# Patient Record
Sex: Female | Born: 1950 | Race: White | Hispanic: No | Marital: Married | Smoking: Never smoker
Health system: Southern US, Community
[De-identification: ages and names within clinical notes are randomized; demographics above are authoritative.]

## PROBLEM LIST (undated history)

## (undated) DIAGNOSIS — E079 Disorder of thyroid, unspecified: Secondary | ICD-10-CM

---

## 1998-03-23 ENCOUNTER — Encounter: Payer: Self-pay | Admitting: Neurosurgery

## 1998-03-23 ENCOUNTER — Ambulatory Visit (HOSPITAL_COMMUNITY): Admission: RE | Admit: 1998-03-23 | Discharge: 1998-03-23 | Payer: Self-pay | Admitting: Neurosurgery

## 1998-04-06 ENCOUNTER — Ambulatory Visit (HOSPITAL_COMMUNITY): Admission: RE | Admit: 1998-04-06 | Discharge: 1998-04-06 | Payer: Self-pay | Admitting: Neurosurgery

## 1998-04-06 ENCOUNTER — Encounter: Payer: Self-pay | Admitting: Neurosurgery

## 1998-12-20 ENCOUNTER — Encounter: Payer: Self-pay | Admitting: Obstetrics and Gynecology

## 1998-12-20 ENCOUNTER — Ambulatory Visit (HOSPITAL_COMMUNITY): Admission: RE | Admit: 1998-12-20 | Discharge: 1998-12-20 | Payer: Self-pay | Admitting: Obstetrics and Gynecology

## 1999-08-22 ENCOUNTER — Other Ambulatory Visit: Admission: RE | Admit: 1999-08-22 | Discharge: 1999-08-22 | Payer: Self-pay | Admitting: Obstetrics and Gynecology

## 1999-12-24 ENCOUNTER — Encounter: Payer: Self-pay | Admitting: Obstetrics and Gynecology

## 1999-12-24 ENCOUNTER — Ambulatory Visit (HOSPITAL_COMMUNITY): Admission: RE | Admit: 1999-12-24 | Discharge: 1999-12-24 | Payer: Self-pay | Admitting: Obstetrics and Gynecology

## 1999-12-28 ENCOUNTER — Encounter: Payer: Self-pay | Admitting: Obstetrics and Gynecology

## 1999-12-28 ENCOUNTER — Encounter: Admission: RE | Admit: 1999-12-28 | Discharge: 1999-12-28 | Payer: Self-pay | Admitting: Obstetrics and Gynecology

## 2000-12-29 ENCOUNTER — Encounter: Admission: RE | Admit: 2000-12-29 | Discharge: 2000-12-29 | Payer: Self-pay | Admitting: Obstetrics and Gynecology

## 2000-12-29 ENCOUNTER — Encounter: Payer: Self-pay | Admitting: Obstetrics and Gynecology

## 2001-12-30 ENCOUNTER — Ambulatory Visit (HOSPITAL_COMMUNITY): Admission: RE | Admit: 2001-12-30 | Discharge: 2001-12-30 | Payer: Self-pay | Admitting: Obstetrics and Gynecology

## 2001-12-30 ENCOUNTER — Encounter: Payer: Self-pay | Admitting: Obstetrics and Gynecology

## 2003-01-03 ENCOUNTER — Ambulatory Visit (HOSPITAL_COMMUNITY): Admission: RE | Admit: 2003-01-03 | Discharge: 2003-01-03 | Payer: Self-pay | Admitting: Obstetrics and Gynecology

## 2004-01-10 ENCOUNTER — Ambulatory Visit (HOSPITAL_COMMUNITY): Admission: RE | Admit: 2004-01-10 | Discharge: 2004-01-10 | Payer: Self-pay | Admitting: Obstetrics and Gynecology

## 2005-01-14 ENCOUNTER — Ambulatory Visit (HOSPITAL_COMMUNITY): Admission: RE | Admit: 2005-01-14 | Discharge: 2005-01-14 | Payer: Self-pay | Admitting: Obstetrics and Gynecology

## 2005-08-20 ENCOUNTER — Other Ambulatory Visit: Admission: RE | Admit: 2005-08-20 | Discharge: 2005-08-20 | Payer: Self-pay | Admitting: Family Medicine

## 2006-01-20 ENCOUNTER — Ambulatory Visit (HOSPITAL_COMMUNITY): Admission: RE | Admit: 2006-01-20 | Discharge: 2006-01-20 | Payer: Self-pay | Admitting: Family Medicine

## 2006-09-03 ENCOUNTER — Other Ambulatory Visit: Admission: RE | Admit: 2006-09-03 | Discharge: 2006-09-03 | Payer: Self-pay | Admitting: Family Medicine

## 2007-02-02 ENCOUNTER — Ambulatory Visit (HOSPITAL_COMMUNITY): Admission: RE | Admit: 2007-02-02 | Discharge: 2007-02-02 | Payer: Self-pay | Admitting: Family Medicine

## 2007-02-19 ENCOUNTER — Encounter: Admission: RE | Admit: 2007-02-19 | Discharge: 2007-02-19 | Payer: Self-pay | Admitting: Family Medicine

## 2007-09-16 ENCOUNTER — Other Ambulatory Visit: Admission: RE | Admit: 2007-09-16 | Discharge: 2007-09-16 | Payer: Self-pay | Admitting: Family Medicine

## 2008-02-19 ENCOUNTER — Ambulatory Visit (HOSPITAL_COMMUNITY): Admission: RE | Admit: 2008-02-19 | Discharge: 2008-02-19 | Payer: Self-pay | Admitting: Family Medicine

## 2008-10-25 ENCOUNTER — Other Ambulatory Visit: Admission: RE | Admit: 2008-10-25 | Discharge: 2008-10-25 | Payer: Self-pay | Admitting: Family Medicine

## 2009-02-23 ENCOUNTER — Ambulatory Visit (HOSPITAL_COMMUNITY): Admission: RE | Admit: 2009-02-23 | Discharge: 2009-02-23 | Payer: Self-pay | Admitting: Family Medicine

## 2009-12-11 ENCOUNTER — Other Ambulatory Visit: Admission: RE | Admit: 2009-12-11 | Discharge: 2009-12-11 | Payer: Self-pay | Admitting: Family Medicine

## 2010-02-27 ENCOUNTER — Ambulatory Visit (HOSPITAL_COMMUNITY): Admission: RE | Admit: 2010-02-27 | Payer: Self-pay | Source: Home / Self Care | Admitting: Family Medicine

## 2010-03-01 ENCOUNTER — Other Ambulatory Visit (HOSPITAL_COMMUNITY): Payer: Self-pay | Admitting: Family Medicine

## 2010-03-01 DIAGNOSIS — Z1231 Encounter for screening mammogram for malignant neoplasm of breast: Secondary | ICD-10-CM

## 2010-03-01 DIAGNOSIS — Z0000 Encounter for general adult medical examination without abnormal findings: Secondary | ICD-10-CM

## 2010-03-04 ENCOUNTER — Encounter: Payer: Self-pay | Admitting: Family Medicine

## 2010-03-28 ENCOUNTER — Ambulatory Visit (HOSPITAL_COMMUNITY): Admission: RE | Admit: 2010-03-28 | Payer: Self-pay | Source: Home / Self Care | Admitting: Family Medicine

## 2010-03-28 ENCOUNTER — Ambulatory Visit (HOSPITAL_COMMUNITY)
Admission: RE | Admit: 2010-03-28 | Discharge: 2010-03-28 | Disposition: A | Discharge status: Home / Self Care | Payer: BC Managed Care – PPO | Source: Ambulatory Visit | Attending: Family Medicine | Admitting: Family Medicine

## 2010-03-28 DIAGNOSIS — Z1231 Encounter for screening mammogram for malignant neoplasm of breast: Secondary | ICD-10-CM | POA: Insufficient documentation

## 2011-02-26 ENCOUNTER — Other Ambulatory Visit (HOSPITAL_COMMUNITY): Payer: Self-pay | Admitting: Family Medicine

## 2011-02-26 DIAGNOSIS — Z1231 Encounter for screening mammogram for malignant neoplasm of breast: Secondary | ICD-10-CM

## 2011-04-02 ENCOUNTER — Ambulatory Visit (HOSPITAL_COMMUNITY)
Admission: RE | Admit: 2011-04-02 | Discharge: 2011-04-02 | Disposition: A | Discharge status: Home / Self Care | Payer: BC Managed Care – PPO | Source: Ambulatory Visit | Attending: Family Medicine | Admitting: Family Medicine

## 2011-04-02 DIAGNOSIS — Z1231 Encounter for screening mammogram for malignant neoplasm of breast: Secondary | ICD-10-CM | POA: Insufficient documentation

## 2013-03-10 ENCOUNTER — Other Ambulatory Visit (HOSPITAL_COMMUNITY)
Admission: RE | Admit: 2013-03-10 | Discharge: 2013-03-10 | Disposition: A | Discharge status: Home / Self Care | Payer: 59 | Source: Ambulatory Visit | Attending: Family Medicine | Admitting: Family Medicine

## 2013-03-10 ENCOUNTER — Other Ambulatory Visit: Payer: Self-pay | Admitting: Family Medicine

## 2013-03-10 DIAGNOSIS — Z0000 Encounter for general adult medical examination without abnormal findings: Secondary | ICD-10-CM | POA: Insufficient documentation

## 2016-01-30 ENCOUNTER — Emergency Department (HOSPITAL_COMMUNITY): Payer: BLUE CROSS/BLUE SHIELD

## 2016-01-30 ENCOUNTER — Encounter (HOSPITAL_COMMUNITY): Payer: Self-pay

## 2016-01-30 ENCOUNTER — Inpatient Hospital Stay (HOSPITAL_COMMUNITY)
Admission: EM | Admit: 2016-01-30 | Discharge: 2016-02-01 | DRG: 193 | Disposition: A | Discharge status: Home / Self Care | Payer: BLUE CROSS/BLUE SHIELD | Attending: Internal Medicine | Admitting: Internal Medicine

## 2016-01-30 DIAGNOSIS — E860 Dehydration: Secondary | ICD-10-CM | POA: Diagnosis present

## 2016-01-30 DIAGNOSIS — J181 Lobar pneumonia, unspecified organism: Secondary | ICD-10-CM | POA: Diagnosis not present

## 2016-01-30 DIAGNOSIS — J189 Pneumonia, unspecified organism: Secondary | ICD-10-CM | POA: Diagnosis present

## 2016-01-30 DIAGNOSIS — D649 Anemia, unspecified: Secondary | ICD-10-CM | POA: Diagnosis present

## 2016-01-30 DIAGNOSIS — E039 Hypothyroidism, unspecified: Secondary | ICD-10-CM | POA: Diagnosis present

## 2016-01-30 DIAGNOSIS — E871 Hypo-osmolality and hyponatremia: Secondary | ICD-10-CM | POA: Diagnosis present

## 2016-01-30 DIAGNOSIS — J9601 Acute respiratory failure with hypoxia: Secondary | ICD-10-CM

## 2016-01-30 DIAGNOSIS — R509 Fever, unspecified: Secondary | ICD-10-CM | POA: Diagnosis not present

## 2016-01-30 HISTORY — DX: Disorder of thyroid, unspecified: E07.9

## 2016-01-30 LAB — CBC WITH DIFFERENTIAL/PLATELET
Basophils Absolute: 0 10*3/uL (ref 0.0–0.1)
Basophils Relative: 0 %
Eosinophils Absolute: 0 10*3/uL (ref 0.0–0.7)
Eosinophils Relative: 0 %
HCT: 36 % (ref 36.0–46.0)
Hemoglobin: 11.8 g/dL — ABNORMAL LOW (ref 12.0–15.0)
Lymphocytes Relative: 14 %
Lymphs Abs: 0.9 10*3/uL (ref 0.7–4.0)
MCH: 28.7 pg (ref 26.0–34.0)
MCHC: 32.8 g/dL (ref 30.0–36.0)
MCV: 87.6 fL (ref 78.0–100.0)
Monocytes Absolute: 0.5 10*3/uL (ref 0.1–1.0)
Monocytes Relative: 7 %
Neutro Abs: 5.1 10*3/uL (ref 1.7–7.7)
Neutrophils Relative %: 79 %
Platelets: 178 10*3/uL (ref 150–400)
RBC: 4.11 MIL/uL (ref 3.87–5.11)
RDW: 13.8 % (ref 11.5–15.5)
WBC: 6.5 10*3/uL (ref 4.0–10.5)

## 2016-01-30 LAB — COMPREHENSIVE METABOLIC PANEL
ALT: 22 U/L (ref 14–54)
AST: 38 U/L (ref 15–41)
Albumin: 4.1 g/dL (ref 3.5–5.0)
Alkaline Phosphatase: 54 U/L (ref 38–126)
Anion gap: 10 (ref 5–15)
BUN: 12 mg/dL (ref 6–20)
CO2: 25 mmol/L (ref 22–32)
Calcium: 9.4 mg/dL (ref 8.9–10.3)
Chloride: 95 mmol/L — ABNORMAL LOW (ref 101–111)
Creatinine, Ser: 0.73 mg/dL (ref 0.44–1.00)
GFR calc Af Amer: >60 mL/min (ref >60)
GFR calc non Af Amer: >60 mL/min (ref >60)
Glucose, Bld: 106 mg/dL — ABNORMAL HIGH (ref 65–99)
Potassium: 4.1 mmol/L (ref 3.5–5.1)
Sodium: 130 mmol/L — ABNORMAL LOW (ref 135–145)
Total Bilirubin: 0.9 mg/dL (ref 0.3–1.2)
Total Protein: 7.3 g/dL (ref 6.5–8.1)

## 2016-01-30 LAB — URINALYSIS, ROUTINE W REFLEX MICROSCOPIC
Bilirubin Urine: NEGATIVE
Glucose, UA: NEGATIVE mg/dL
Ketones, ur: 80 mg/dL — AB
Leukocytes, UA: NEGATIVE
Nitrite: NEGATIVE
Protein, ur: 30 mg/dL — AB
Specific Gravity, Urine: 1.016 (ref 1.005–1.030)
pH: 5 (ref 5.0–8.0)

## 2016-01-30 LAB — I-STAT CG4 LACTIC ACID, ED: Lactic Acid, Venous: 1.44 mmol/L (ref 0.5–1.9)

## 2016-01-30 LAB — I-STAT BETA HCG BLOOD, ED (MC, WL, AP ONLY): I-stat hCG, quantitative: 7.9 m[IU]/mL — ABNORMAL HIGH (ref <5)

## 2016-01-30 LAB — PROTIME-INR
INR: 1.01
Prothrombin Time: 13.3 seconds (ref 11.4–15.2)

## 2016-01-30 MED ORDER — DEXTROSE 5 % IV SOLN: 1.0000 g | INTRAVENOUS | Status: DC

## 2016-01-30 MED ORDER — DEXTROSE 5 % IV SOLN
1.0000 g | Freq: Once | INTRAVENOUS | Status: AC
  Administered 2016-01-30: 1 g via INTRAVENOUS
  Filled 2016-01-30: qty 10

## 2016-01-30 MED ORDER — ONDANSETRON HCL 4 MG PO TABS: 4.0000 mg | ORAL_TABLET | Freq: Four times a day (QID) | ORAL | Status: DC | PRN

## 2016-01-30 MED ORDER — DEXTROSE 5 % IV SOLN
500.0000 mg | Freq: Once | INTRAVENOUS | Status: AC
  Administered 2016-01-31: 500 mg via INTRAVENOUS
  Filled 2016-01-30: qty 500

## 2016-01-30 MED ORDER — ACETAMINOPHEN 650 MG RE SUPP: 650.0000 mg | Freq: Four times a day (QID) | RECTAL | Status: DC | PRN

## 2016-01-30 MED ORDER — ONDANSETRON HCL 4 MG/2ML IJ SOLN: 4.0000 mg | Freq: Four times a day (QID) | INTRAMUSCULAR | Status: DC | PRN

## 2016-01-30 MED ORDER — DEXTROSE 5 % IV SOLN
1.0000 g | INTRAVENOUS | Status: DC
  Administered 2016-01-31: 1 g via INTRAVENOUS
  Filled 2016-01-30 (×2): qty 10

## 2016-01-30 MED ORDER — SODIUM CHLORIDE 0.9 % IV SOLN
INTRAVENOUS | Status: AC
  Administered 2016-01-31 (×2): via INTRAVENOUS

## 2016-01-30 MED ORDER — ENOXAPARIN SODIUM 40 MG/0.4ML ~~LOC~~ SOLN
40.0000 mg | SUBCUTANEOUS | Status: DC
  Administered 2016-02-01: 40 mg via SUBCUTANEOUS
  Filled 2016-01-30: qty 0.4

## 2016-01-30 MED ORDER — ACETAMINOPHEN 325 MG PO TABS
650.0000 mg | ORAL_TABLET | Freq: Four times a day (QID) | ORAL | Status: DC | PRN
  Administered 2016-01-31: 650 mg via ORAL
  Filled 2016-01-30: qty 2

## 2016-01-30 MED ORDER — ACETAMINOPHEN 325 MG PO TABS
650.0000 mg | ORAL_TABLET | Freq: Once | ORAL | Status: AC
  Administered 2016-01-30: 650 mg via ORAL
  Filled 2016-01-30: qty 2

## 2016-01-30 MED ORDER — LEVOTHYROXINE SODIUM 75 MCG PO TABS
75.0000 ug | ORAL_TABLET | Freq: Every day | ORAL | Status: DC
  Administered 2016-01-31 – 2016-02-01 (×2): 75 ug via ORAL
  Filled 2016-01-30 (×2): qty 1

## 2016-01-30 MED ORDER — DEXTROSE 5 % IV SOLN
500.0000 mg | INTRAVENOUS | Status: DC
  Administered 2016-02-01: 500 mg via INTRAVENOUS
  Filled 2016-01-30: qty 500

## 2016-01-30 NOTE — H&P (Signed)
History and Physical    Krystal Richards [id number] DOB: [birth year] DOA: 01/30/2016  PCP: No primary care provider on file.  Patient coming from: Home.  Chief Complaint: Fever.  HPI: Krystal Richards is a 65 y.o. female with history of hypothyroidism presents to the ER because of persistent fever. Patient states 3 days ago patient started developing  productive cough with fever and chills. Symptoms persisted and patient had gone to her PCP yesterday and was prescribed antitussives. Despite which patient was having persistent symptoms and patient came to the ER. In the ER patient had a fever of 103F with tachypnea mildly hypoxic and chest x-ray showing multifocal pneumonia. Patient is being admitted for observation for pneumonia. Patient states that she did come in contact with her grandkid who had an ear infection.   ED Course: IV antibiotics were started with fluids after blood cultures obtained.  Review of Systems: As per HPI, rest all negative.   Past Medical History:  Diagnosis Date  . Thyroid disease     Past Surgical History:  Procedure Laterality Date  . CESAREAN SECTION       reports that she has never smoked. She has never used smokeless tobacco. She reports that she does not drink alcohol. Her drug history is not on file.  No Known Allergies  Family History  Problem Relation Age of Onset  . Diabetes Mellitus II Neg Hx   . Hypertension Neg Hx     Prior to Admission medications   Medication Sig Start Date End Date Taking? Authorizing Provider  chlorpheniramine-HYDROcodone (TUSSIONEX PENNKINETIC ER) 10-8 MG/5ML SUER Take 5 mLs by mouth every 12 (twelve) hours as needed for cough.   Yes Historical Provider, MD  levothyroxine (SYNTHROID, LEVOTHROID) 75 MCG tablet Take 75 mcg by mouth daily before breakfast.   Yes Historical Provider, MD    Physical Exam: Vitals:   01/30/16 2103 01/30/16 2105 01/30/16 2148  BP: 110/73    Pulse: 107    Resp: 18    Temp: (!) 103.1  F (39.5 C)    TempSrc: Oral    SpO2: 95%  97%  Weight:  53.5 kg (118 lb)   Height:  5\' 2"  (1.575 m)       Constitutional: Moderately built and nourished. Vitals:   01/30/16 2103 01/30/16 2105 01/30/16 2148  BP: 110/73    Pulse: 107    Resp: 18    Temp: (!) 103.1 F (39.5 C)    TempSrc: Oral    SpO2: 95%  97%  Weight:  53.5 kg (118 lb)   Height:  5\' 2"  (1.575 m)    Eyes: Anicteric no pallor. ENMT: No discharge from the ears eyes nose or mouth. Neck: No mass. There are no neck rigidity. Respiratory: No rhonchi or crepitation. Cardiovascular: S1-S2 heard. No murmurs appreciated. Abdomen: Soft nontender bowel sounds present. No guarding or rigidity. Musculoskeletal: No edema. No joint effusion. Skin: Appears warm. No rash. Neurologic: Alert awake oriented to time place and person. Moves all activities. Psychiatric: Appears normal. Normal affect.   Labs on Admission: I have personally reviewed following labs and imaging studies  CBC:  Recent Labs Lab 01/30/16 2142  WBC 6.5  NEUTROABS 5.1  HGB 11.8*  HCT 36.0  MCV 87.6  PLT 0000000   Basic Metabolic Panel:  Recent Labs Lab 01/30/16 2142  NA 130*  K 4.1  CL 95*  CO2 25  GLUCOSE 106*  BUN 12  CREATININE 0.73  CALCIUM 9.4  GFR: Estimated Creatinine Clearance: 56.9 mL/min (by C-G formula based on SCr of 0.73 mg/dL). Liver Function Tests:  Recent Labs Lab 01/30/16 2142  AST 38  ALT 22  ALKPHOS 54  BILITOT 0.9  PROT 7.3  ALBUMIN 4.1   No results for input(s): LIPASE, AMYLASE in the last 168 hours. No results for input(s): AMMONIA in the last 168 hours. Coagulation Profile:  Recent Labs Lab 01/30/16 2142  INR 1.01   Cardiac Enzymes: No results for input(s): CKTOTAL, CKMB, CKMBINDEX, TROPONINI in the last 168 hours. BNP (last 3 results) No results for input(s): PROBNP in the last 8760 hours. HbA1C: No results for input(s): HGBA1C in the last 72 hours. CBG: No results for input(s): GLUCAP in  the last 168 hours. Lipid Profile: No results for input(s): CHOL, HDL, LDLCALC, TRIG, CHOLHDL, LDLDIRECT in the last 72 hours. Thyroid Function Tests: No results for input(s): TSH, T4TOTAL, FREET4, T3FREE, THYROIDAB in the last 72 hours. Anemia Panel: No results for input(s): VITAMINB12, FOLATE, FERRITIN, TIBC, IRON, RETICCTPCT in the last 72 hours. Urine analysis:    Component Value Date/Time   COLORURINE AMBER (A) 01/30/2016 2125   APPEARANCEUR CLOUDY (A) 01/30/2016 2125   LABSPEC 1.016 01/30/2016 2125   PHURINE 5.0 01/30/2016 2125   GLUCOSEU NEGATIVE 01/30/2016 2125   HGBUR SMALL (A) 01/30/2016 2125   BILIRUBINUR NEGATIVE 01/30/2016 2125   KETONESUR 80 (A) 01/30/2016 2125   PROTEINUR 30 (A) 01/30/2016 2125   NITRITE NEGATIVE 01/30/2016 2125   LEUKOCYTESUR NEGATIVE 01/30/2016 2125   Sepsis Labs: @LABRCNTIP (procalcitonin:4,lacticidven:4) )No results found for this or any previous visit (from the past 240 hour(s)).   Radiological Exams on Admission: Dg Chest 2 View  Result Date: 01/30/2016 CLINICAL DATA:  Fever EXAM: CHEST  2 VIEW COMPARISON:  None. FINDINGS: There are opacities at the right lung base and left mid lung, likely early consolidation. There is no pneumothorax or pleural effusion. No pulmonary edema. Cardiomediastinal contours are normal. IMPRESSION: Opacities at the right lung base and, to a lesser extent, within the left mid lung suggest early consolidation, likely indicating pneumonia. Electronically Signed   By: Ulyses Jarred M.D.   On: 01/30/2016 22:16     Assessment/Plan Principal Problem:   Community acquired pneumonia Active Problems:   Hypothyroidism   Pneumonia    1. Community-acquired pneumonia/multifocal pneumonia - patient is mildly tachypneic with tachycardia and hypoxic on admission. Will observe overnight with IV antibiotics follow blood cultures sputum cultures urine for Legionella and strep antigen and HIV status. Check influenza  PCR. 2. Hypothyroidism on Synthroid. 3. Mild anemia - follow CBC.  Has nonspecific beta-hCG positive. Further workup as an outpatient.   DVT prophylaxis: Lovenox. Code Status: Full code.  Family Communication: Discussed with patient.  Disposition Plan: Home.  Consults called: None.  Admission status: Observation.    Rise Patience MD [facility] Pager [phone].  If 7PM-7AM, please contact night-coverage [email or web address] Password [email or web address]  01/30/2016, 11:41 PM

## 2016-01-30 NOTE — Progress Notes (Signed)
Pharmacy Antibiotic Note  Krystal Richards is a 65 y.o. female c/o cough, congestion and myalgia.  CXR likely PNA admitted on 01/30/2016 with pneumonia.  Pharmacy has been consulted for rocephin/zmax dosing.  Plan: Rocephin 1 Gm IV q24h Zmax 500 mg IV q24h  Height: 5\' 2"  (157.5 cm) Weight: 118 lb (53.5 kg) IBW/kg (Calculated) : 50.1  Temp (24hrs), Avg:103.1 F (39.5 C), Min:103.1 F (39.5 C), Max:103.1 F (39.5 C)   Recent Labs Lab 01/30/16 2142 01/30/16 2206  WBC 6.5  --   CREATININE 0.73  --   LATICACIDVEN  --  1.44    Estimated Creatinine Clearance: 56.9 mL/min (by C-G formula based on SCr of 0.73 mg/dL).    No Known Allergies  Antimicrobials this admission: 12/19 rocephin >>  12/19 zmax >>   Dose adjustments this admission:   Microbiology results:  BCx:   UCx:    Sputum:    MRSA PCR:   Thank you for allowing pharmacy to be a part of this patient's care. Rx will sign off as no further dosing adjusments required.   Dorrene German 01/30/2016 11:20 PM

## 2016-01-30 NOTE — ED Provider Notes (Signed)
[facility] DEPT Provider Note   CSN: [id number] Arrival date & time: 01/30/16  2052     History   Chief Complaint Chief Complaint  Patient presents with  . Fever    HPI Krystal Richards is a 65 y.o. female.  65 year old female presents with several-day history of cough and congestion as well as myalgias. Denies any vomiting or diarrhea. No ear pain or sore throat. Saw her Dr. yesterday for similar symptoms and was diagnosed with viral illness as well as possible sinusitis and given a prescription for a biotics. Do not get those filled and today she felt worse. Her temperature went to 104 at home. She's not eating or drinking. She does note some weakness and dizziness. Called her Dr. was told to come here.      Past Medical History:  Diagnosis Date  . Thyroid disease     There are no active problems to display for this patient.   History reviewed. No pertinent surgical history.  OB History    No data available       Home Medications    Prior to Admission medications   Medication Sig Start Date End Date Taking? Authorizing Provider  chlorpheniramine-HYDROcodone (TUSSIONEX PENNKINETIC ER) 10-8 MG/5ML SUER Take 5 mLs by mouth every 12 (twelve) hours as needed for cough.   Yes Historical Provider, MD  levothyroxine (SYNTHROID, LEVOTHROID) 75 MCG tablet Take 75 mcg by mouth daily before breakfast.   Yes Historical Provider, MD    Family History History reviewed. No pertinent family history.  Social History Social History  Substance Use Topics  . Smoking status: Never Smoker  . Smokeless tobacco: Never Used  . Alcohol use No     Allergies   Patient has no known allergies.   Review of Systems Review of Systems  All other systems reviewed and are negative.    Physical Exam Updated Vital Signs BP 110/73 (BP Location: Left Arm)   Pulse 107   Temp (!) 103.1 F (39.5 C) (Oral)   Resp 18   Ht 5\' 2"  (1.575 m)   Wt 53.5 kg   SpO2 97%   BMI 21.58  kg/m   Physical Exam  Constitutional: She is oriented to person, place, and time. She appears well-developed and well-nourished.  Non-toxic appearance. No distress.  HENT:  Head: Normocephalic and atraumatic.  Eyes: Conjunctivae, EOM and lids are normal. Pupils are equal, round, and reactive to light.  Neck: Normal range of motion. Neck supple. No tracheal deviation present. No thyroid mass present.  Cardiovascular: Regular rhythm and normal heart sounds.  Tachycardia present.  Exam reveals no gallop.   No murmur heard. Pulmonary/Chest: Effort normal. No stridor. No respiratory distress. She has decreased breath sounds in the right lower field and the left lower field. She has no wheezes. She has no rhonchi. She has no rales.  Abdominal: Soft. Normal appearance and bowel sounds are normal. She exhibits no distension. There is no tenderness. There is no rebound and no CVA tenderness.  Musculoskeletal: Normal range of motion. She exhibits no edema or tenderness.  Neurological: She is alert and oriented to person, place, and time. She has normal strength. No cranial nerve deficit or sensory deficit. GCS eye subscore is 4. GCS verbal subscore is 5. GCS motor subscore is 6.  Skin: Skin is warm and dry. No abrasion and no rash noted.  Psychiatric: She has a normal mood and affect. Her speech is normal and behavior is normal.  Nursing note and  vitals reviewed.    ED Treatments / Results  Labs (all labs ordered are listed, but only abnormal results are displayed) Labs Reviewed  COMPREHENSIVE METABOLIC PANEL - Abnormal; Notable for the following:       Result Value   Sodium 130 (*)    Chloride 95 (*)    Glucose, Bld 106 (*)    All other components within normal limits  CBC WITH DIFFERENTIAL/PLATELET - Abnormal; Notable for the following:    Hemoglobin 11.8 (*)    All other components within normal limits  URINALYSIS, ROUTINE W REFLEX MICROSCOPIC - Abnormal; Notable for the following:     Color, Urine AMBER (*)    APPearance CLOUDY (*)    Hgb urine dipstick SMALL (*)    Ketones, ur 80 (*)    Protein, ur 30 (*)    Bacteria, UA RARE (*)    Squamous Epithelial / LPF 0-5 (*)    All other components within normal limits  I-STAT BETA HCG BLOOD, ED ([facility], [facility], [facility] ONLY) - Abnormal; Notable for the following:    I-stat hCG, quantitative 7.9 (*)    All other components within normal limits  CULTURE, BLOOD (ROUTINE X 2)  CULTURE, BLOOD (ROUTINE X 2)  URINE CULTURE  PROTIME-INR  I-STAT CG4 LACTIC ACID, ED    EKG  EKG Interpretation None       Radiology Dg Chest 2 View  Result Date: 01/30/2016 CLINICAL DATA:  Fever EXAM: CHEST  2 VIEW COMPARISON:  None. FINDINGS: There are opacities at the right lung base and left mid lung, likely early consolidation. There is no pneumothorax or pleural effusion. No pulmonary edema. Cardiomediastinal contours are normal. IMPRESSION: Opacities at the right lung base and, to a lesser extent, within the left mid lung suggest early consolidation, likely indicating pneumonia. Electronically Signed   By: Ulyses Jarred M.D.   On: 01/30/2016 22:16    Procedures Procedures (including critical care time)  Medications Ordered in ED Medications  cefTRIAXone (ROCEPHIN) 1 g in dextrose 5 % 50 mL IVPB (not administered)  azithromycin (ZITHROMAX) 500 mg in dextrose 5 % 250 mL IVPB (not administered)  acetaminophen (TYLENOL) tablet 650 mg (not administered)     Initial Impression / Assessment and Plan / ED Course  I have reviewed the triage vital signs and the nursing notes.  Pertinent labs & imaging results that were available during my care of the patient were reviewed by me and considered in my medical decision making (see chart for details).  Clinical Course   Patient's chest x-ray results noted and patient started on IV antibiotics for community acquired pneumonia. Also has evidence of dehydration given IV fluids. To be treated with Tylenol.  Discussed with hospitalist and patient will be admitted  Final Clinical Impressions(s) / ED Diagnoses   Final diagnoses:  None    New Prescriptions New Prescriptions   No medications on file     Lacretia Leigh, MD 01/30/16 2254

## 2016-01-30 NOTE — ED Notes (Signed)
ATTEMPTED TO COLLECT SECOND SET OF CULTURES SEVERAL TIMES, WAS ABLE TO GET FIRST IV AND SET OF CULTURES,  ACCIDENTALLY CLICKED SECOND SET OFF, IF ABX ARE GIVEN WILL ATTEMPT TO GET SECOND CULTURE AGAIN

## 2016-01-30 NOTE — ED Triage Notes (Addendum)
Pt was seen at her primary doctor yesterday  and dx with a sinus infection, she has a fever now of 103,

## 2016-01-31 DIAGNOSIS — E860 Dehydration: Secondary | ICD-10-CM | POA: Diagnosis present

## 2016-01-31 DIAGNOSIS — E038 Other specified hypothyroidism: Secondary | ICD-10-CM

## 2016-01-31 DIAGNOSIS — J189 Pneumonia, unspecified organism: Secondary | ICD-10-CM | POA: Diagnosis not present

## 2016-01-31 DIAGNOSIS — J9601 Acute respiratory failure with hypoxia: Secondary | ICD-10-CM

## 2016-01-31 DIAGNOSIS — E871 Hypo-osmolality and hyponatremia: Secondary | ICD-10-CM | POA: Diagnosis present

## 2016-01-31 DIAGNOSIS — J181 Lobar pneumonia, unspecified organism: Secondary | ICD-10-CM | POA: Diagnosis present

## 2016-01-31 DIAGNOSIS — D649 Anemia, unspecified: Secondary | ICD-10-CM | POA: Diagnosis present

## 2016-01-31 DIAGNOSIS — E039 Hypothyroidism, unspecified: Secondary | ICD-10-CM | POA: Diagnosis present

## 2016-01-31 DIAGNOSIS — R509 Fever, unspecified: Secondary | ICD-10-CM | POA: Diagnosis present

## 2016-01-31 LAB — CBC
HCT: 31.8 % — ABNORMAL LOW (ref 36.0–46.0)
Hemoglobin: 10.7 g/dL — ABNORMAL LOW (ref 12.0–15.0)
MCH: 29.2 pg (ref 26.0–34.0)
MCHC: 33.6 g/dL (ref 30.0–36.0)
MCV: 86.9 fL (ref 78.0–100.0)
PLATELETS: 162 10*3/uL (ref 150–400)
RBC: 3.66 MIL/uL — ABNORMAL LOW (ref 3.87–5.11)
RDW: 13.9 % (ref 11.5–15.5)
WBC: 6.9 10*3/uL (ref 4.0–10.5)

## 2016-01-31 LAB — BASIC METABOLIC PANEL
Anion gap: 7 (ref 5–15)
BUN: 8 mg/dL (ref 6–20)
CHLORIDE: 105 mmol/L (ref 101–111)
CO2: 24 mmol/L (ref 22–32)
CREATININE: 0.57 mg/dL (ref 0.44–1.00)
Calcium: 8.2 mg/dL — ABNORMAL LOW (ref 8.9–10.3)
GFR calc Af Amer: >60 mL/min (ref >60)
GFR calc non Af Amer: >60 mL/min (ref >60)
GLUCOSE: 106 mg/dL — AB (ref 65–99)
Potassium: 3.9 mmol/L (ref 3.5–5.1)
SODIUM: 136 mmol/L (ref 135–145)

## 2016-01-31 LAB — STREP PNEUMONIAE URINARY ANTIGEN: Strep Pneumo Urinary Antigen: NEGATIVE

## 2016-01-31 LAB — INFLUENZA PANEL BY PCR (TYPE A & B)
Influenza A By PCR: NEGATIVE
Influenza B By PCR: NEGATIVE

## 2016-01-31 LAB — HIV ANTIBODY (ROUTINE TESTING W REFLEX): HIV SCREEN 4TH GENERATION: NONREACTIVE

## 2016-01-31 LAB — HCG, QUANTITATIVE, PREGNANCY: HCG, BETA CHAIN, QUANT, S: 1 m[IU]/mL (ref <5)

## 2016-01-31 MED ORDER — BENZONATATE 100 MG PO CAPS
200.0000 mg | ORAL_CAPSULE | Freq: Three times a day (TID) | ORAL | Status: DC
  Administered 2016-01-31 – 2016-02-01 (×4): 200 mg via ORAL
  Filled 2016-01-31 (×4): qty 2

## 2016-01-31 MED ORDER — SODIUM CHLORIDE 0.9 % IV BOLUS (SEPSIS)
1000.0000 mL | Freq: Once | INTRAVENOUS | Status: AC
  Administered 2016-01-31: 1000 mL via INTRAVENOUS

## 2016-01-31 MED ORDER — HYDROCODONE-HOMATROPINE 5-1.5 MG/5ML PO SYRP: 5.0000 mL | ORAL_SOLUTION | ORAL | Status: DC | PRN

## 2016-01-31 NOTE — ED Notes (Signed)
Shanean Dillen, husband [phone]

## 2016-01-31 NOTE — ED Notes (Signed)
Attempted to call report.  RN stated that she will call back in a few minutes.

## 2016-01-31 NOTE — Progress Notes (Signed)
PROGRESS NOTE  Krystal Richards [id number] DOB: [birth year] DOA: 01/30/2016 PCP: No primary care provider on file.  Brief History:  65 year old female with a history of hypothyroidism presents with three-day history of fevers, chills, and worsening cough. The patient states that her fever was up to 104.52F on the day prior to admission. She denies any headache, neck pain, chest pain, nausea, vomiting, diarrhea, dysuria, hematuria. Chest x-ray showed right lower lobe and left middle lobe opacities. The patient had a fever of 100.58F at the time of presentation with oxygen saturation of 87% on room air. The patient was started on azithromycin and ceftriaxone with intravenous fluids.  Assessment/Plan: Acute respiratory failure with hypoxia -Significant pneumonia -Wean oxygen off for saturation greater than 92%  Lobar pneumonia -Urine Streptococcus pneumoniae antigen negative -Continue ceftriaxone and azithromycin -Follow blood cultures -Influenza PCR negative -Continue IV fluids as the patient's oral intake is still poor  Positive point of care hCG -Likely spurious -Patient is postmenopausal  Hyponatremia -Secondary to volume depletion -Improved with IV fluids  Hypothyroidism -Continue Synthroid    Disposition Plan:   Home in 1-2 days if stable Family Communication:   No Family at bedside  Consultants:  none  Code Status:  FULL  DVT Prophylaxis:   [place] Lovenox   Procedures: As Listed in Progress Note Above  Antibiotics: None    Subjective: Patient is feeling better than yesterday. She continues to have a nonproductive cough. Denies any fevers, chills, chest pain, nausea, vomiting, diarrhea, abdominal pain. No dysuria or hematuria. Denies any headache or neck pain.  Objective: Vitals:   01/31/16 0221 01/31/16 0223 01/31/16 0435 01/31/16 0535  BP: 96/58 96/58 100/71 (!) 106/50  Pulse: 86 83 94 95  Resp: 18 17 16 17   Temp:   99.2 F (37.3 C) 98.8 F  (37.1 C)  TempSrc:   Oral Oral  SpO2: 92% 92% (!) 87% 93%  Weight:    53.5 kg (118 lb)  Height:    5\' 2"  (1.575 m)   No intake or output data in the 24 hours ending 01/31/16 1203 Weight change:  Exam:   General:  Pt is alert, follows commands appropriately, not in acute distress  HEENT: No icterus, No thrush, No neck mass, [place]/AT  Cardiovascular: RRR, S1/S2, no rubs, no gallops  Respiratory: Bibasilar crackles, right greater than left. No wheezing.  Abdomen: Soft/+BS, non tender, non distended, no guarding  Extremities: No edema, No lymphangitis, No petechiae, No rashes, no synovitis   Data Reviewed: I have personally reviewed following labs and imaging studies Basic Metabolic Panel:  Recent Labs Lab 01/30/16 2142 01/31/16 0613  NA 130* 136  K 4.1 3.9  CL 95* 105  CO2 25 24  GLUCOSE 106* 106*  BUN 12 8  CREATININE 0.73 0.57  CALCIUM 9.4 8.2*   Liver Function Tests:  Recent Labs Lab 01/30/16 2142  AST 38  ALT 22  ALKPHOS 54  BILITOT 0.9  PROT 7.3  ALBUMIN 4.1   No results for input(s): LIPASE, AMYLASE in the last 168 hours. No results for input(s): AMMONIA in the last 168 hours. Coagulation Profile:  Recent Labs Lab 01/30/16 2142  INR 1.01   CBC:  Recent Labs Lab 01/30/16 2142 01/31/16 0613  WBC 6.5 6.9  NEUTROABS 5.1  --   HGB 11.8* 10.7*  HCT 36.0 31.8*  MCV 87.6 86.9  PLT 178 162   Cardiac Enzymes: No results for input(s): CKTOTAL, CKMB,  CKMBINDEX, TROPONINI in the last 168 hours. BNP: Invalid input(s): POCBNP CBG: No results for input(s): GLUCAP in the last 168 hours. HbA1C: No results for input(s): HGBA1C in the last 72 hours. Urine analysis:    Component Value Date/Time   COLORURINE AMBER (A) 01/30/2016 2125   APPEARANCEUR CLOUDY (A) 01/30/2016 2125   LABSPEC 1.016 01/30/2016 2125   PHURINE 5.0 01/30/2016 2125   GLUCOSEU NEGATIVE 01/30/2016 2125   HGBUR SMALL (A) 01/30/2016 2125   BILIRUBINUR NEGATIVE 01/30/2016 2125    KETONESUR 80 (A) 01/30/2016 2125   PROTEINUR 30 (A) 01/30/2016 2125   NITRITE NEGATIVE 01/30/2016 2125   LEUKOCYTESUR NEGATIVE 01/30/2016 2125   Sepsis Labs: @LABRCNTIP (procalcitonin:4,lacticidven:4) )No results found for this or any previous visit (from the past 240 hour(s)).   Scheduled Meds: . azithromycin  500 mg Intravenous Q24H  . cefTRIAXone (ROCEPHIN)  IV  1 g Intravenous Q24H  . enoxaparin (LOVENOX) injection  40 mg Subcutaneous Q24H  . levothyroxine  75 mcg Oral QAC breakfast   Continuous Infusions: . sodium chloride 75 mL/hr at 01/31/16 1120    Procedures/Studies: Dg Chest 2 View  Result Date: 01/30/2016 CLINICAL DATA:  Fever EXAM: CHEST  2 VIEW COMPARISON:  None. FINDINGS: There are opacities at the right lung base and left mid lung, likely early consolidation. There is no pneumothorax or pleural effusion. No pulmonary edema. Cardiomediastinal contours are normal. IMPRESSION: Opacities at the right lung base and, to a lesser extent, within the left mid lung suggest early consolidation, likely indicating pneumonia. Electronically Signed   By: Ulyses Jarred M.D.   On: 01/30/2016 22:16    Jerrold Haskell, DO  [facility] Pager [phone]  If 7PM-7AM, please contact night-coverage [email or web address] Password [email or web address] 01/31/2016, 12:03 PM   LOS: 0 days

## 2016-01-31 NOTE — ED Notes (Signed)
Pt reports that she can't give sputum.  Hospitalist aware.

## 2016-02-01 LAB — BASIC METABOLIC PANEL
Anion gap: 6 (ref 5–15)
BUN: 6 mg/dL (ref 6–20)
CHLORIDE: 108 mmol/L (ref 101–111)
CO2: 25 mmol/L (ref 22–32)
CREATININE: 0.55 mg/dL (ref 0.44–1.00)
Calcium: 8.3 mg/dL — ABNORMAL LOW (ref 8.9–10.3)
GFR calc Af Amer: >60 mL/min (ref >60)
GFR calc non Af Amer: >60 mL/min (ref >60)
Glucose, Bld: 102 mg/dL — ABNORMAL HIGH (ref 65–99)
Potassium: 3.6 mmol/L (ref 3.5–5.1)
Sodium: 139 mmol/L (ref 135–145)

## 2016-02-01 LAB — URINE CULTURE

## 2016-02-01 MED ORDER — BENZONATATE 200 MG PO CAPS: 200.0000 mg | ORAL_CAPSULE | Freq: Three times a day (TID) | ORAL | 0 refills | Status: AC

## 2016-02-01 MED ORDER — AZITHROMYCIN 500 MG PO TABS: 500.0000 mg | ORAL_TABLET | Freq: Every day | ORAL | 0 refills | Status: AC

## 2016-02-01 MED ORDER — AZITHROMYCIN 250 MG PO TABS: 500.0000 mg | ORAL_TABLET | Freq: Every day | ORAL | Status: DC

## 2016-02-01 MED ORDER — CEFDINIR 300 MG PO CAPS: 300.0000 mg | ORAL_CAPSULE | Freq: Two times a day (BID) | ORAL | 0 refills | Status: AC

## 2016-02-01 NOTE — Discharge Summary (Addendum)
Physician Discharge Summary  Krystal Richards [id number] DOB: 1950-06-02 DOA: 01/30/2016  PCP: No primary care provider on file.  Admit date: 01/30/2016 Discharge date: 02/01/2016  Admitted From: Home Disposition:  HOME  Recommendations for Outpatient Follow-up:  1. Follow up with PCP in 1-2 weeks 2. Please obtain BMP/CBC in one week   Home Health:No Equipment/Devices: none  Discharge Condition:stable CODE STATUS:FULL Diet recommendation:  Regular   Brief/Interim Summary: 65 year old female with a history of hypothyroidism presents with three-day history of fevers, chills, and worsening cough. The patient states that her fever was up to 104.26F on the day prior to admission. She denies any headache, neck pain, chest pain, nausea, vomiting, diarrhea, dysuria, hematuria. Chest x-ray showed right lower lobe and left middle lobe opacities. The patient had a fever of 100.19F at the time of presentation with oxygen saturation of 87% on room air. The patient was started on azithromycin and ceftriaxone with intravenous fluids.  Discharge Diagnoses:  Acute respiratory failure with hypoxia -Significant pneumonia -Wean oxygen off for saturation greater than 92% -stable on RA at time of d/c  Lobar pneumonia -Urine Streptococcus pneumoniae antigen negative -Continue ceftriaxone and azithromycin during the hospitalization -home with omnicef and azithro x 5 additional days to complete 7 days of therapy -Follow blood cultures--neg at time of d/c -Influenza PCR negative -Continue IV fluids as the patient's oral intake is still poor during hospitalization -Tessalon for cough--pt states Tussionex made her delirious  Positive point of care hCG -Likely spurious -Patient is postmenopausal -serum HCG--neg  Hyponatremia -Secondary to volume depletion -Improved with IV fluids  Hypothyroidism -Continue Synthroid    Discharge Instructions  Discharge Instructions    Diet - low  sodium heart healthy    Complete by:  As directed    Increase activity slowly    Complete by:  As directed      Allergies as of 02/01/2016   No Known Allergies     Medication List    TAKE these medications   azithromycin 500 MG tablet Commonly known as:  ZITHROMAX Take 1 tablet (500 mg total) by mouth daily.   benzonatate 200 MG capsule Commonly known as:  TESSALON Take 1 capsule (200 mg total) by mouth 3 (three) times daily.   cefdinir 300 MG capsule Commonly known as:  OMNICEF Take 1 capsule (300 mg total) by mouth 2 (two) times daily.   levothyroxine 75 MCG tablet Commonly known as:  SYNTHROID, LEVOTHROID Take 75 mcg by mouth daily before breakfast.   TUSSIONEX PENNKINETIC ER 10-8 MG/5ML Suer Generic drug:  chlorpheniramine-HYDROcodone Take 5 mLs by mouth every 12 (twelve) hours as needed for cough.       No Known Allergies  Consultations:  none   Procedures/Studies: Dg Chest 2 View  Result Date: 01/30/2016 CLINICAL DATA:  Fever EXAM: CHEST  2 VIEW COMPARISON:  None. FINDINGS: There are opacities at the right lung base and left mid lung, likely early consolidation. There is no pneumothorax or pleural effusion. No pulmonary edema. Cardiomediastinal contours are normal. IMPRESSION: Opacities at the right lung base and, to a lesser extent, within the left mid lung suggest early consolidation, likely indicating pneumonia. Electronically Signed   By: Ulyses Jarred M.D.   On: 01/30/2016 22:16        Discharge Exam: Vitals:   02/01/16 0442 02/01/16 1446  BP: 111/73 126/78  Pulse: 87 92  Resp: 19 17  Temp: 98.9 F (37.2 C) 98.5 F (36.9 C)   Vitals:   01/31/16  1500 01/31/16 2000 02/01/16 0442 02/01/16 1446  BP: (!) 92/55 101/73 111/73 126/78  Pulse: 85 87 87 92  Resp: 18 18 19 17   Temp: 100.1 F (37.8 C) 100.2 F (37.9 C) 98.9 F (37.2 C) 98.5 F (36.9 C)  TempSrc: Oral Oral Oral Oral  SpO2: 92% 92% 94% 95%  Weight:      Height:         General: Pt is alert, awake, not in acute distress Cardiovascular: RRR, S1/S2 +, no rubs, no gallops Respiratory: bibasilar rales R>L Abdominal: Soft, NT, ND, bowel sounds + Extremities: no edema, no cyanosis   The results of significant diagnostics from this hospitalization (including imaging, microbiology, ancillary and laboratory) are listed below for reference.    Significant Diagnostic Studies: Dg Chest 2 View  Result Date: 01/30/2016 CLINICAL DATA:  Fever EXAM: CHEST  2 VIEW COMPARISON:  None. FINDINGS: There are opacities at the right lung base and left mid lung, likely early consolidation. There is no pneumothorax or pleural effusion. No pulmonary edema. Cardiomediastinal contours are normal. IMPRESSION: Opacities at the right lung base and, to a lesser extent, within the left mid lung suggest early consolidation, likely indicating pneumonia. Electronically Signed   By: Ulyses Jarred M.D.   On: 01/30/2016 22:16     Microbiology: Recent Results (from the past 240 hour(s))  Urine culture     Status: Abnormal   Collection Time: 01/30/16  9:25 PM  Result Value Ref Range Status   Specimen Description URINE, CLEAN CATCH  Final   Special Requests NONE  Final   Culture MULTIPLE SPECIES PRESENT, SUGGEST RECOLLECTION (A)  Final   Report Status 02/01/2016 FINAL  Final  Culture, blood (Routine x 2)     Status: None (Preliminary result)   Collection Time: 01/30/16  9:42 PM  Result Value Ref Range Status   Specimen Description BLOOD LEFT ANTECUBITAL  Final   Special Requests BOTTLES DRAWN AEROBIC AND ANAEROBIC 5ML  Final   Culture   Final    NO GROWTH < 12 HOURS Performed at [facility]    Report Status PENDING  Incomplete     Labs: Basic Metabolic Panel:  Recent Labs Lab 01/30/16 2142 01/31/16 0613 02/01/16 0537  NA 130* 136 139  K 4.1 3.9 3.6  CL 95* 105 108  CO2 25 24 25   GLUCOSE 106* 106* 102*  BUN 12 8 6   CREATININE 0.73 0.57 0.55  CALCIUM 9.4 8.2*  8.3*   Liver Function Tests:  Recent Labs Lab 01/30/16 2142  AST 38  ALT 22  ALKPHOS 54  BILITOT 0.9  PROT 7.3  ALBUMIN 4.1   No results for input(s): LIPASE, AMYLASE in the last 168 hours. No results for input(s): AMMONIA in the last 168 hours. CBC:  Recent Labs Lab 01/30/16 2142 01/31/16 0613  WBC 6.5 6.9  NEUTROABS 5.1  --   HGB 11.8* 10.7*  HCT 36.0 31.8*  MCV 87.6 86.9  PLT 178 162   Cardiac Enzymes: No results for input(s): CKTOTAL, CKMB, CKMBINDEX, TROPONINI in the last 168 hours. BNP: Invalid input(s): POCBNP CBG: No results for input(s): GLUCAP in the last 168 hours.  Time coordinating discharge:  Greater than 30 minutes  Signed:  Adria Costley, DO [facility] Pager: [phone] 02/01/2016, 3:06 PM

## 2016-02-01 NOTE — Progress Notes (Signed)
PHARMACIST - PHYSICIAN COMMUNICATION DR:   Tat CONCERNING: Antibiotic IV to Oral Route Change Policy  RECOMMENDATION: This patient is receiving Zithromax by the intravenous route.  Based on criteria approved by the Pharmacy and Therapeutics Committee, the antibiotic(s) is/are being converted to the equivalent oral dose form(s).   DESCRIPTION: These criteria include:  Patient being treated for a respiratory tract infection, urinary tract infection, cellulitis or clostridium difficile associated diarrhea if on metronidazole  The patient is not neutropenic and does not exhibit a GI malabsorption state  The patient is eating (either orally or via tube) and/or has been taking other orally administered medications for a least 24 hours  The patient is improving clinically and has a Tmax < 100.5  If you have questions about this conversion, please contact the Pharmacy Department  []   [phone] )  [facility] []   [phone] )  [facility] []   [phone] )  [facility] []   [phone] )  [facility] [x]   [phone] )  [place], PharmD, BCPS Pager: [phone] 02/01/2016@1 :18 PM

## 2016-02-01 NOTE — Progress Notes (Signed)
02/01/16  1645  Reviewed discharge instructions with patient. Patient verbalized understanding of discharge instructions. Copy of discharge instructions given to patient.

## 2016-02-02 LAB — LEGIONELLA PNEUMOPHILA SEROGP 1 UR AG: L. pneumophila Serogp 1 Ur Ag: NEGATIVE

## 2016-02-04 LAB — CULTURE, BLOOD (ROUTINE X 2): Culture: NO GROWTH

## 2016-02-05 LAB — CULTURE, BLOOD (ROUTINE X 2): Culture: NO GROWTH

## 2016-02-21 ENCOUNTER — Other Ambulatory Visit: Payer: Self-pay | Admitting: Family Medicine

## 2016-02-21 ENCOUNTER — Ambulatory Visit
Admission: RE | Admit: 2016-02-21 | Discharge: 2016-02-21 | Disposition: A | Discharge status: Home / Self Care | Payer: BLUE CROSS/BLUE SHIELD | Source: Ambulatory Visit | Attending: Family Medicine | Admitting: Family Medicine

## 2016-02-21 DIAGNOSIS — J189 Pneumonia, unspecified organism: Secondary | ICD-10-CM

## 2016-05-06 ENCOUNTER — Other Ambulatory Visit (HOSPITAL_COMMUNITY)
Admission: RE | Admit: 2016-05-06 | Discharge: 2016-05-06 | Disposition: A | Discharge status: Home / Self Care | Payer: BLUE CROSS/BLUE SHIELD | Source: Ambulatory Visit | Attending: Family Medicine | Admitting: Family Medicine

## 2016-05-06 ENCOUNTER — Other Ambulatory Visit: Payer: Self-pay | Admitting: Family Medicine

## 2016-05-06 DIAGNOSIS — Z1151 Encounter for screening for human papillomavirus (HPV): Secondary | ICD-10-CM | POA: Diagnosis not present

## 2016-05-06 DIAGNOSIS — Z01411 Encounter for gynecological examination (general) (routine) with abnormal findings: Secondary | ICD-10-CM | POA: Diagnosis present

## 2016-05-07 LAB — CYTOLOGY - PAP
ADEQUACY: ABSENT
Diagnosis: NEGATIVE
HPV (WINDOPATH): DETECTED — AB

## 2016-11-13 ENCOUNTER — Other Ambulatory Visit: Payer: Self-pay | Admitting: Family Medicine

## 2016-11-13 ENCOUNTER — Ambulatory Visit
Admission: RE | Admit: 2016-11-13 | Discharge: 2016-11-13 | Disposition: A | Discharge status: Home / Self Care | Payer: BLUE CROSS/BLUE SHIELD | Source: Ambulatory Visit | Attending: Family Medicine | Admitting: Family Medicine

## 2016-11-13 DIAGNOSIS — J069 Acute upper respiratory infection, unspecified: Secondary | ICD-10-CM

## 2017-12-18 DIAGNOSIS — H6591 Unspecified nonsuppurative otitis media, right ear: Secondary | ICD-10-CM | POA: Diagnosis not present

## 2017-12-18 DIAGNOSIS — J0100 Acute maxillary sinusitis, unspecified: Secondary | ICD-10-CM | POA: Diagnosis not present

## 2018-02-23 DIAGNOSIS — R69 Illness, unspecified: Secondary | ICD-10-CM | POA: Diagnosis not present

## 2018-03-05 DIAGNOSIS — R69 Illness, unspecified: Secondary | ICD-10-CM | POA: Diagnosis not present

## 2018-03-23 DIAGNOSIS — H524 Presbyopia: Secondary | ICD-10-CM | POA: Diagnosis not present

## 2018-03-23 DIAGNOSIS — H5213 Myopia, bilateral: Secondary | ICD-10-CM | POA: Diagnosis not present

## 2018-03-23 DIAGNOSIS — H52223 Regular astigmatism, bilateral: Secondary | ICD-10-CM | POA: Diagnosis not present

## 2018-03-31 DIAGNOSIS — N751 Abscess of Bartholin's gland: Secondary | ICD-10-CM | POA: Diagnosis not present

## 2018-04-06 DIAGNOSIS — H43813 Vitreous degeneration, bilateral: Secondary | ICD-10-CM | POA: Diagnosis not present

## 2018-04-06 DIAGNOSIS — H43393 Other vitreous opacities, bilateral: Secondary | ICD-10-CM | POA: Diagnosis not present

## 2018-04-06 DIAGNOSIS — H35372 Puckering of macula, left eye: Secondary | ICD-10-CM | POA: Diagnosis not present

## 2018-04-06 DIAGNOSIS — H3589 Other specified retinal disorders: Secondary | ICD-10-CM | POA: Diagnosis not present

## 2018-08-11 DIAGNOSIS — E7800 Pure hypercholesterolemia, unspecified: Secondary | ICD-10-CM | POA: Diagnosis not present

## 2018-08-11 DIAGNOSIS — R8290 Unspecified abnormal findings in urine: Secondary | ICD-10-CM | POA: Diagnosis not present

## 2018-08-11 DIAGNOSIS — E039 Hypothyroidism, unspecified: Secondary | ICD-10-CM | POA: Diagnosis not present

## 2018-08-24 DIAGNOSIS — R69 Illness, unspecified: Secondary | ICD-10-CM | POA: Diagnosis not present

## 2018-10-01 IMAGING — CR DG CHEST 2V
2 series · 2 of 2 positions shown · non-contrast
Comparison: None.

CLINICAL DATA: Fever

EXAM:
CHEST  2 VIEW

[w chest pa]
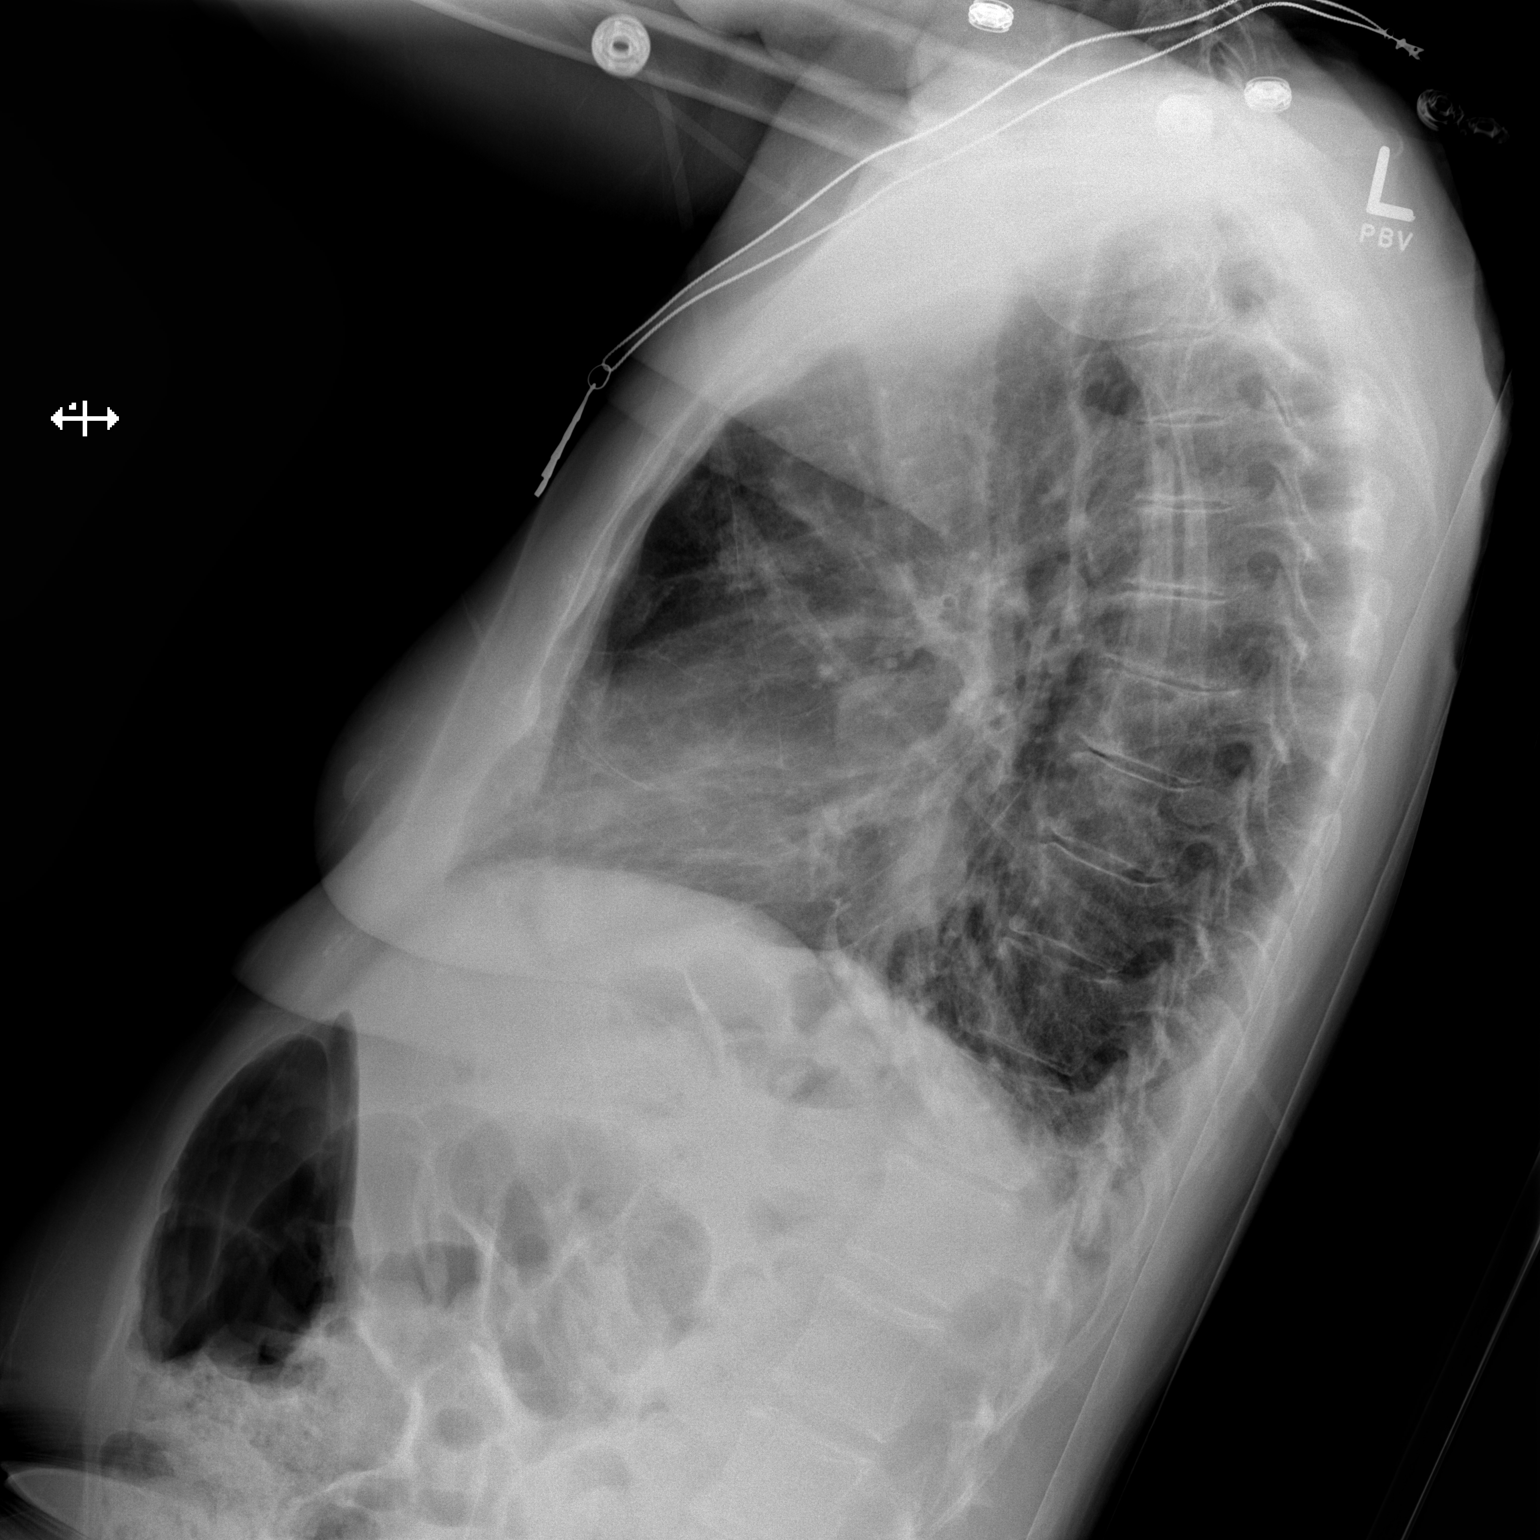

[x chest ap]
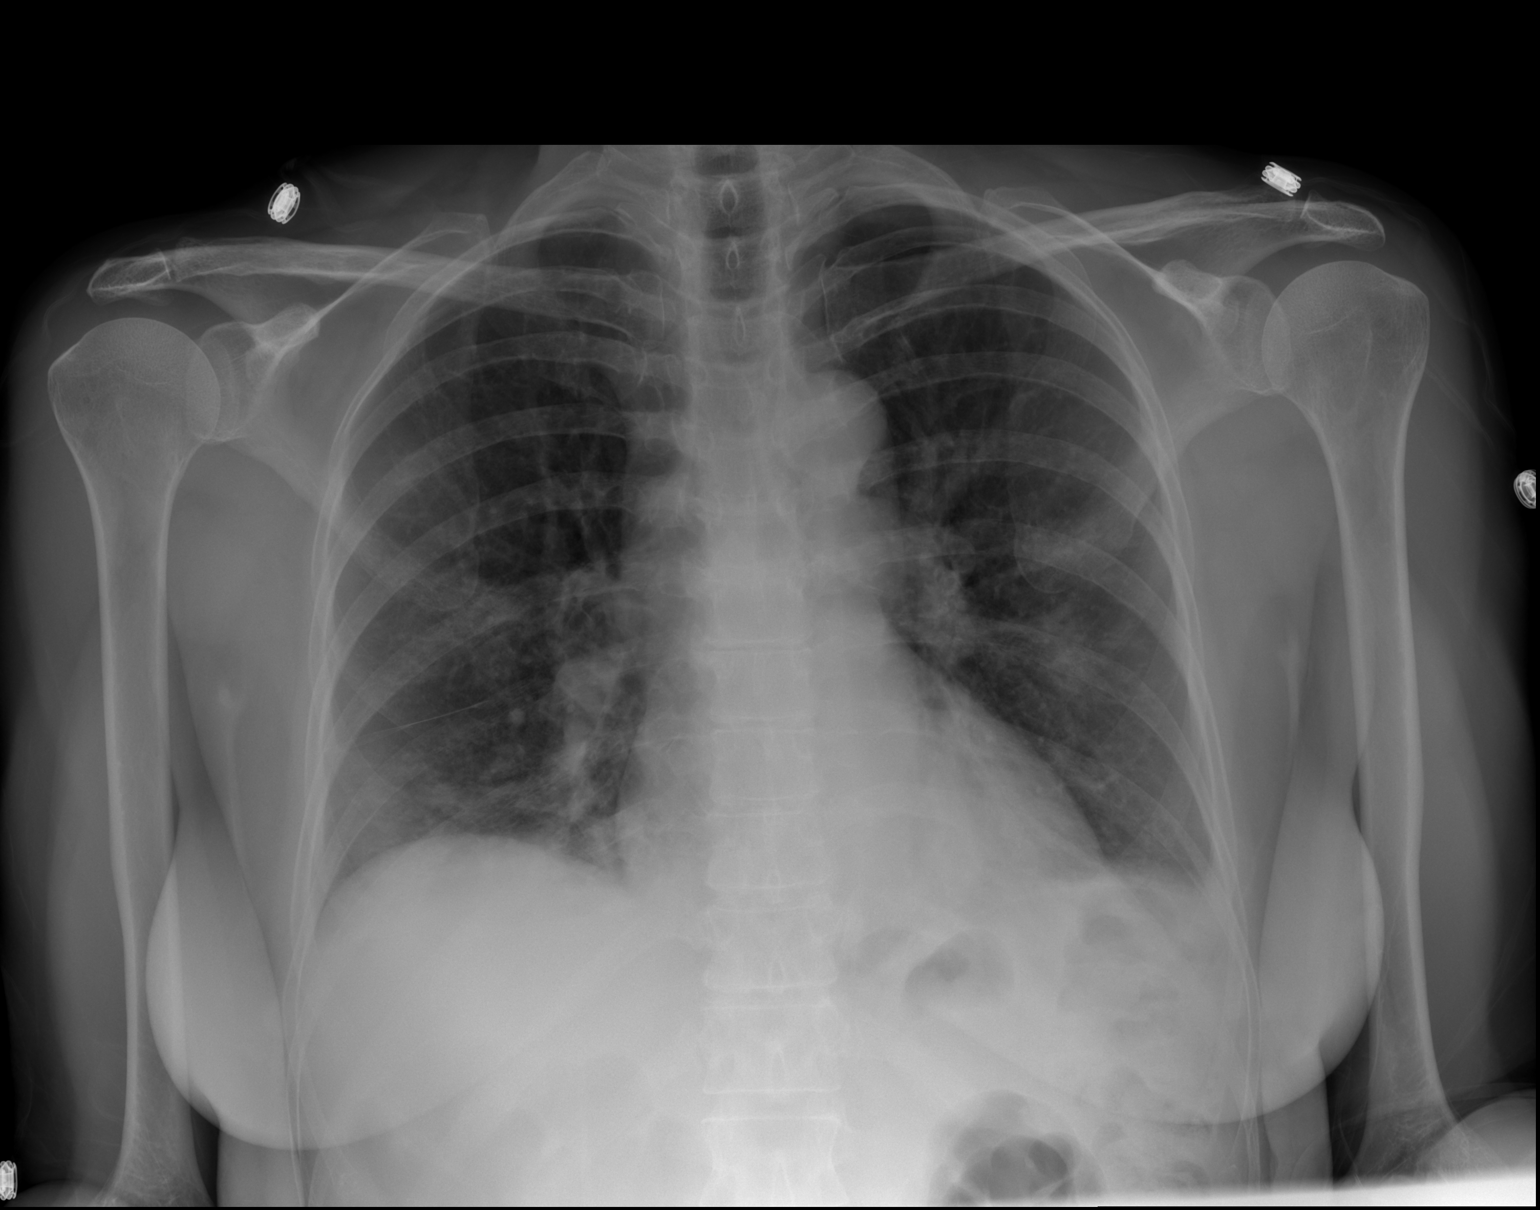

[2 of 2 positions shown; findings below may reference images not displayed]

FINDINGS: There are opacities at the right lung base and left mid lung, likely
early consolidation. There is no pneumothorax or pleural effusion.
No pulmonary edema. Cardiomediastinal contours are normal.
IMPRESSION: Opacities at the right lung base and, to a lesser extent, within the
left mid lung suggest early consolidation, likely indicating
pneumonia.

## 2018-10-12 DIAGNOSIS — D229 Melanocytic nevi, unspecified: Secondary | ICD-10-CM | POA: Diagnosis not present

## 2018-10-12 DIAGNOSIS — D1801 Hemangioma of skin and subcutaneous tissue: Secondary | ICD-10-CM | POA: Diagnosis not present

## 2018-10-12 DIAGNOSIS — L814 Other melanin hyperpigmentation: Secondary | ICD-10-CM | POA: Diagnosis not present

## 2018-10-12 DIAGNOSIS — L821 Other seborrheic keratosis: Secondary | ICD-10-CM | POA: Diagnosis not present

## 2018-10-12 DIAGNOSIS — D485 Neoplasm of uncertain behavior of skin: Secondary | ICD-10-CM | POA: Diagnosis not present

## 2018-10-12 DIAGNOSIS — L570 Actinic keratosis: Secondary | ICD-10-CM | POA: Diagnosis not present

## 2018-10-12 DIAGNOSIS — Z85828 Personal history of other malignant neoplasm of skin: Secondary | ICD-10-CM | POA: Diagnosis not present

## 2018-10-12 DIAGNOSIS — L819 Disorder of pigmentation, unspecified: Secondary | ICD-10-CM | POA: Diagnosis not present

## 2018-10-13 DIAGNOSIS — L821 Other seborrheic keratosis: Secondary | ICD-10-CM | POA: Diagnosis not present

## 2018-12-28 ENCOUNTER — Other Ambulatory Visit: Payer: Self-pay | Admitting: Family Medicine

## 2018-12-28 ENCOUNTER — Other Ambulatory Visit (HOSPITAL_COMMUNITY)
Admission: RE | Admit: 2018-12-28 | Discharge: 2018-12-28 | Disposition: A | Discharge status: Home / Self Care | Payer: Medicare HMO | Source: Ambulatory Visit | Attending: Family Medicine | Admitting: Family Medicine

## 2018-12-28 DIAGNOSIS — Z23 Encounter for immunization: Secondary | ICD-10-CM | POA: Diagnosis not present

## 2018-12-28 DIAGNOSIS — R69 Illness, unspecified: Secondary | ICD-10-CM | POA: Diagnosis not present

## 2018-12-28 DIAGNOSIS — Z1231 Encounter for screening mammogram for malignant neoplasm of breast: Secondary | ICD-10-CM | POA: Diagnosis not present

## 2018-12-28 DIAGNOSIS — E039 Hypothyroidism, unspecified: Secondary | ICD-10-CM | POA: Diagnosis not present

## 2018-12-28 DIAGNOSIS — E7800 Pure hypercholesterolemia, unspecified: Secondary | ICD-10-CM | POA: Diagnosis not present

## 2018-12-28 DIAGNOSIS — Z0000 Encounter for general adult medical examination without abnormal findings: Secondary | ICD-10-CM | POA: Diagnosis not present

## 2018-12-28 DIAGNOSIS — M810 Age-related osteoporosis without current pathological fracture: Secondary | ICD-10-CM | POA: Diagnosis not present

## 2018-12-28 DIAGNOSIS — R87618 Other abnormal cytological findings on specimens from cervix uteri: Secondary | ICD-10-CM | POA: Diagnosis not present

## 2018-12-28 DIAGNOSIS — Z1151 Encounter for screening for human papillomavirus (HPV): Secondary | ICD-10-CM | POA: Insufficient documentation

## 2018-12-28 DIAGNOSIS — L918 Other hypertrophic disorders of the skin: Secondary | ICD-10-CM | POA: Diagnosis not present

## 2018-12-31 LAB — CYTOLOGY - PAP
Comment: NEGATIVE
Diagnosis: NEGATIVE
High risk HPV: NEGATIVE

## 2019-01-06 DIAGNOSIS — M8589 Other specified disorders of bone density and structure, multiple sites: Secondary | ICD-10-CM | POA: Diagnosis not present

## 2019-01-06 DIAGNOSIS — Z1231 Encounter for screening mammogram for malignant neoplasm of breast: Secondary | ICD-10-CM | POA: Diagnosis not present

## 2019-01-06 DIAGNOSIS — Z780 Asymptomatic menopausal state: Secondary | ICD-10-CM | POA: Diagnosis not present

## 2019-03-11 DIAGNOSIS — L918 Other hypertrophic disorders of the skin: Secondary | ICD-10-CM | POA: Diagnosis not present

## 2019-03-18 DIAGNOSIS — R69 Illness, unspecified: Secondary | ICD-10-CM | POA: Diagnosis not present

## 2019-04-28 DIAGNOSIS — R69 Illness, unspecified: Secondary | ICD-10-CM | POA: Diagnosis not present

## 2019-05-03 DIAGNOSIS — H35372 Puckering of macula, left eye: Secondary | ICD-10-CM | POA: Diagnosis not present

## 2019-06-29 DIAGNOSIS — E7800 Pure hypercholesterolemia, unspecified: Secondary | ICD-10-CM | POA: Diagnosis not present

## 2019-06-29 DIAGNOSIS — M810 Age-related osteoporosis without current pathological fracture: Secondary | ICD-10-CM | POA: Diagnosis not present

## 2019-06-29 DIAGNOSIS — E039 Hypothyroidism, unspecified: Secondary | ICD-10-CM | POA: Diagnosis not present

## 2019-07-16 IMAGING — CR DG CHEST 2V
2 series · 2 of 2 positions shown · non-contrast
Comparison: 02/21/2016

CLINICAL DATA: Viral upper respiratory tract infection

EXAM:
CHEST  2 VIEW

[w chest pa]
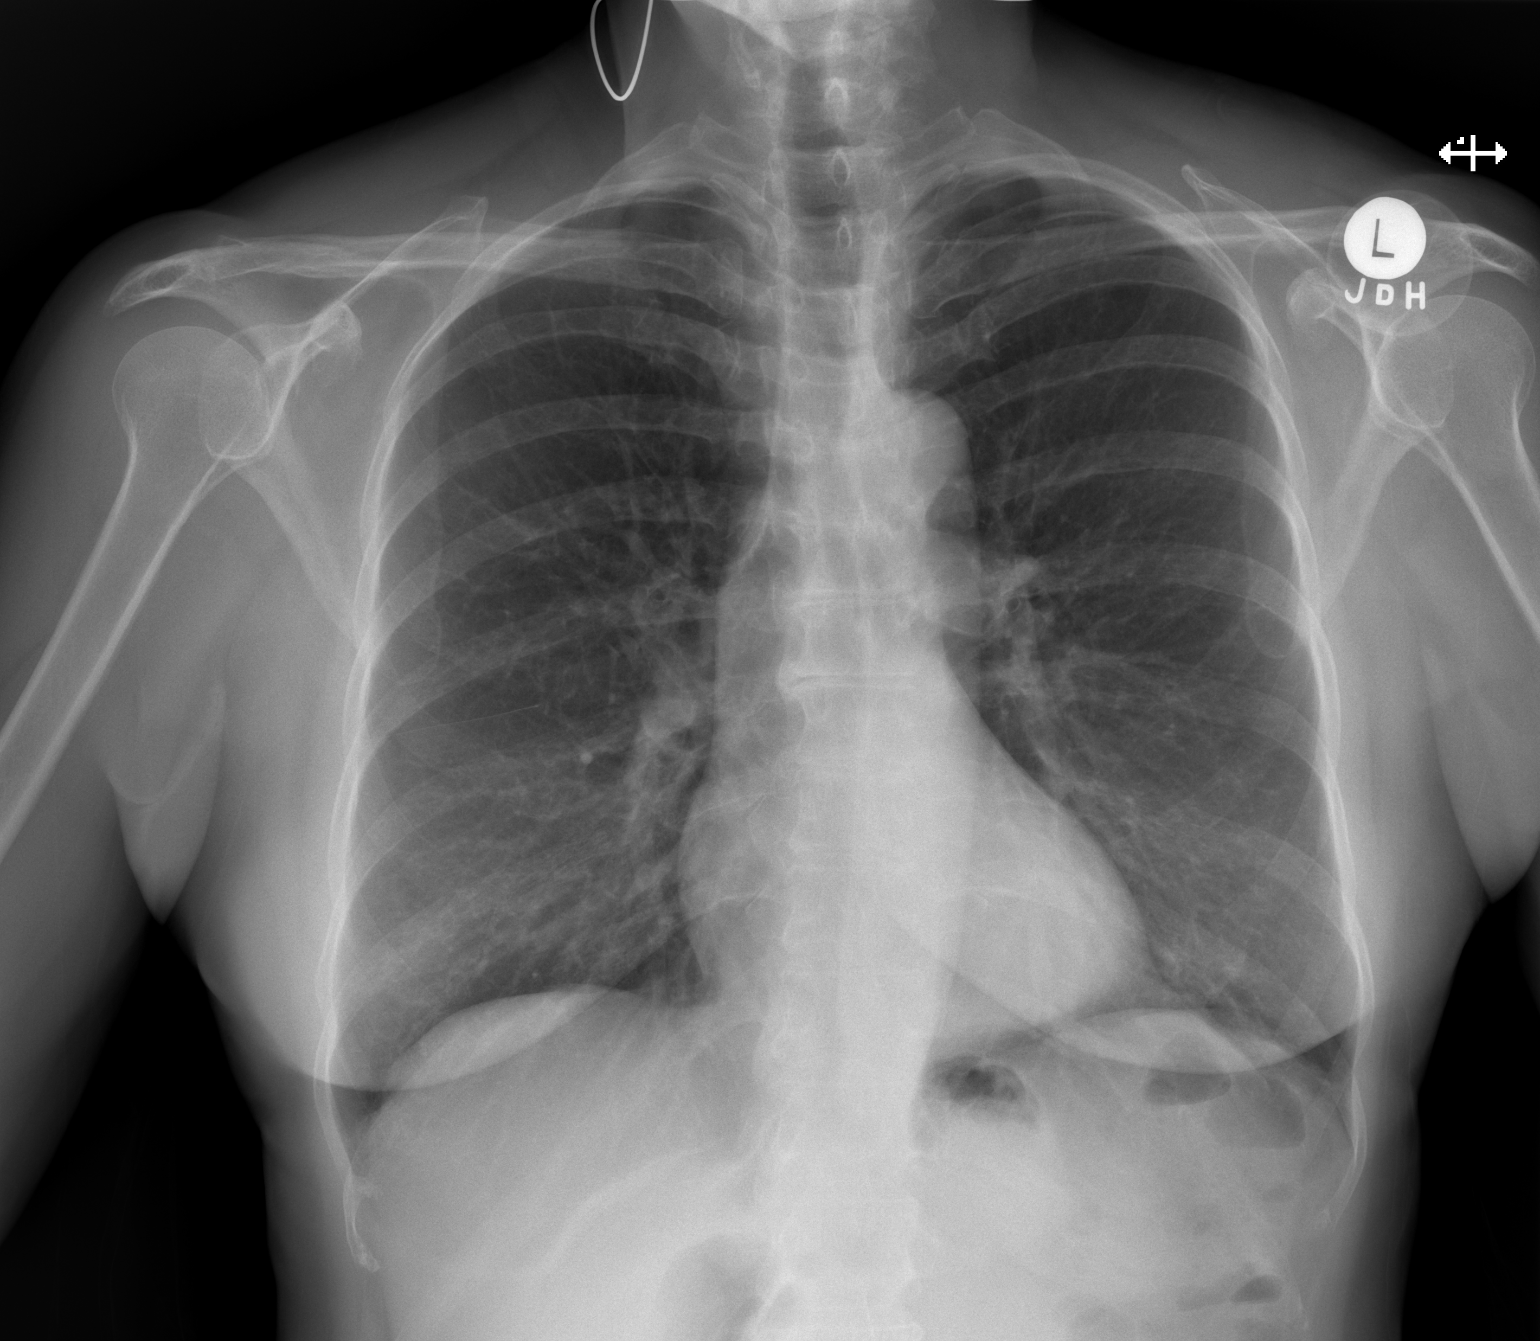

[w chest lat]
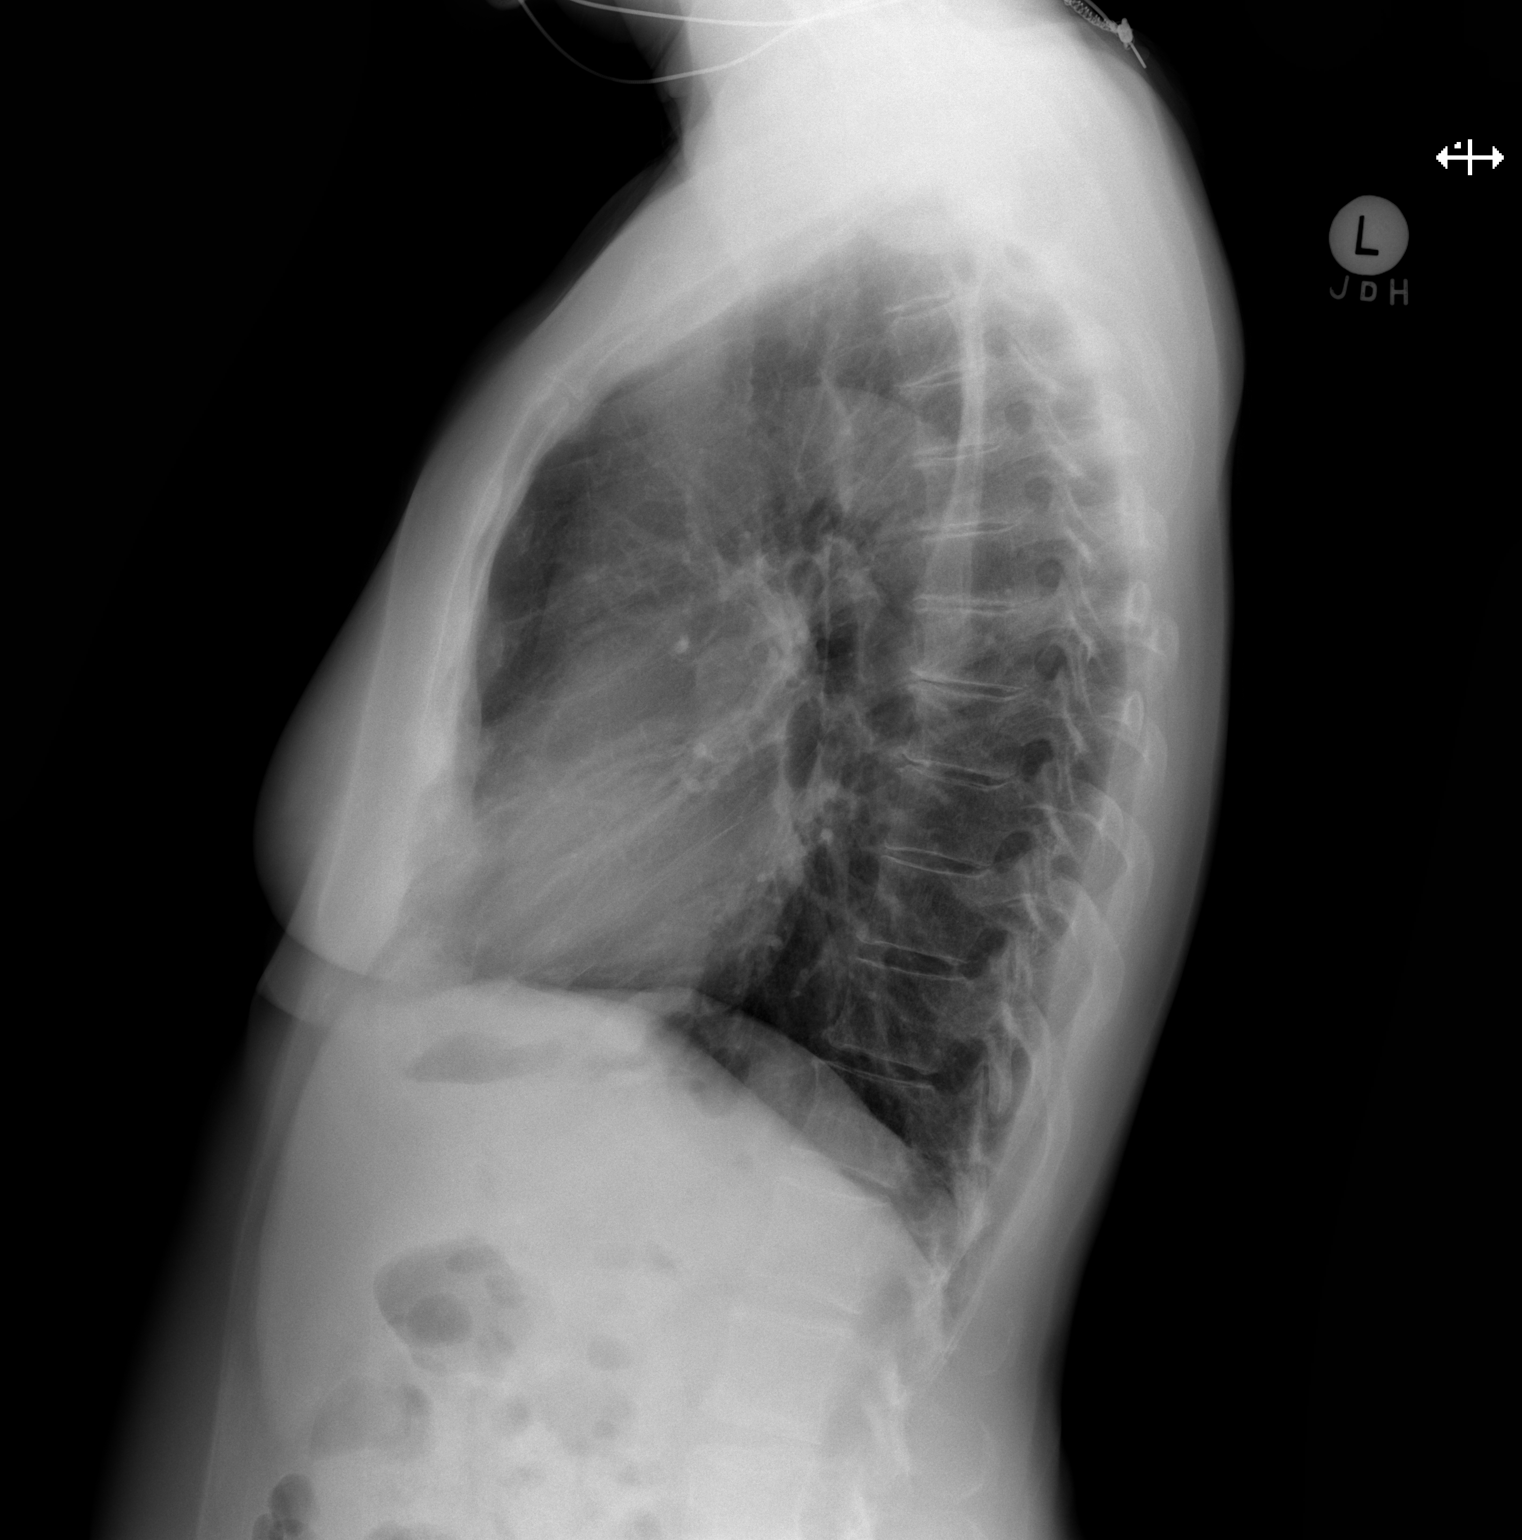

[2 of 2 positions shown; findings below may reference images not displayed]

FINDINGS: The heart size and mediastinal contours are within normal limits.
Both lungs are clear. Mild degenerative changes of the spine.
IMPRESSION: No active cardiopulmonary disease.

## 2019-09-20 DIAGNOSIS — R69 Illness, unspecified: Secondary | ICD-10-CM | POA: Diagnosis not present

## 2019-11-03 DIAGNOSIS — L905 Scar conditions and fibrosis of skin: Secondary | ICD-10-CM | POA: Diagnosis not present

## 2019-11-03 DIAGNOSIS — Z85828 Personal history of other malignant neoplasm of skin: Secondary | ICD-10-CM | POA: Diagnosis not present

## 2019-11-03 DIAGNOSIS — L819 Disorder of pigmentation, unspecified: Secondary | ICD-10-CM | POA: Diagnosis not present

## 2019-11-03 DIAGNOSIS — H0015 Chalazion left lower eyelid: Secondary | ICD-10-CM | POA: Diagnosis not present

## 2019-11-03 DIAGNOSIS — D1801 Hemangioma of skin and subcutaneous tissue: Secondary | ICD-10-CM | POA: Diagnosis not present

## 2019-11-03 DIAGNOSIS — D229 Melanocytic nevi, unspecified: Secondary | ICD-10-CM | POA: Diagnosis not present

## 2019-11-03 DIAGNOSIS — L821 Other seborrheic keratosis: Secondary | ICD-10-CM | POA: Diagnosis not present

## 2019-11-03 DIAGNOSIS — L814 Other melanin hyperpigmentation: Secondary | ICD-10-CM | POA: Diagnosis not present

## 2020-01-31 DIAGNOSIS — R0981 Nasal congestion: Secondary | ICD-10-CM | POA: Diagnosis not present

## 2020-02-16 DIAGNOSIS — E039 Hypothyroidism, unspecified: Secondary | ICD-10-CM | POA: Diagnosis not present

## 2020-02-16 DIAGNOSIS — E7800 Pure hypercholesterolemia, unspecified: Secondary | ICD-10-CM | POA: Diagnosis not present

## 2020-05-04 DIAGNOSIS — H35373 Puckering of macula, bilateral: Secondary | ICD-10-CM | POA: Diagnosis not present

## 2020-05-16 DIAGNOSIS — Z0100 Encounter for examination of eyes and vision without abnormal findings: Secondary | ICD-10-CM | POA: Diagnosis not present

## 2020-06-29 DIAGNOSIS — E039 Hypothyroidism, unspecified: Secondary | ICD-10-CM | POA: Diagnosis not present

## 2020-06-29 DIAGNOSIS — R42 Dizziness and giddiness: Secondary | ICD-10-CM | POA: Diagnosis not present

## 2020-06-29 DIAGNOSIS — R946 Abnormal results of thyroid function studies: Secondary | ICD-10-CM | POA: Diagnosis not present

## 2020-07-11 DIAGNOSIS — Z1152 Encounter for screening for COVID-19: Secondary | ICD-10-CM | POA: Diagnosis not present

## 2020-10-03 DIAGNOSIS — E039 Hypothyroidism, unspecified: Secondary | ICD-10-CM | POA: Diagnosis not present

## 2020-10-30 DIAGNOSIS — M810 Age-related osteoporosis without current pathological fracture: Secondary | ICD-10-CM | POA: Diagnosis not present

## 2020-10-30 DIAGNOSIS — E039 Hypothyroidism, unspecified: Secondary | ICD-10-CM | POA: Diagnosis not present

## 2020-10-30 DIAGNOSIS — Z0000 Encounter for general adult medical examination without abnormal findings: Secondary | ICD-10-CM | POA: Diagnosis not present

## 2020-10-30 DIAGNOSIS — N393 Stress incontinence (female) (male): Secondary | ICD-10-CM | POA: Diagnosis not present

## 2020-10-30 DIAGNOSIS — E7800 Pure hypercholesterolemia, unspecified: Secondary | ICD-10-CM | POA: Diagnosis not present

## 2020-11-07 DIAGNOSIS — L821 Other seborrheic keratosis: Secondary | ICD-10-CM | POA: Diagnosis not present

## 2020-11-07 DIAGNOSIS — L814 Other melanin hyperpigmentation: Secondary | ICD-10-CM | POA: Diagnosis not present

## 2020-11-07 DIAGNOSIS — L812 Freckles: Secondary | ICD-10-CM | POA: Diagnosis not present

## 2020-11-07 DIAGNOSIS — L819 Disorder of pigmentation, unspecified: Secondary | ICD-10-CM | POA: Diagnosis not present

## 2020-11-07 DIAGNOSIS — D225 Melanocytic nevi of trunk: Secondary | ICD-10-CM | POA: Diagnosis not present

## 2020-11-07 DIAGNOSIS — Z85828 Personal history of other malignant neoplasm of skin: Secondary | ICD-10-CM | POA: Diagnosis not present

## 2020-11-07 DIAGNOSIS — Z08 Encounter for follow-up examination after completed treatment for malignant neoplasm: Secondary | ICD-10-CM | POA: Diagnosis not present

## 2021-03-08 DIAGNOSIS — Z780 Asymptomatic menopausal state: Secondary | ICD-10-CM | POA: Diagnosis not present

## 2021-03-08 DIAGNOSIS — M8589 Other specified disorders of bone density and structure, multiple sites: Secondary | ICD-10-CM | POA: Diagnosis not present

## 2021-05-24 DIAGNOSIS — H35373 Puckering of macula, bilateral: Secondary | ICD-10-CM | POA: Diagnosis not present

## 2021-11-13 DIAGNOSIS — Z85828 Personal history of other malignant neoplasm of skin: Secondary | ICD-10-CM | POA: Diagnosis not present

## 2021-11-13 DIAGNOSIS — L821 Other seborrheic keratosis: Secondary | ICD-10-CM | POA: Diagnosis not present

## 2021-11-13 DIAGNOSIS — Z08 Encounter for follow-up examination after completed treatment for malignant neoplasm: Secondary | ICD-10-CM | POA: Diagnosis not present

## 2021-11-13 DIAGNOSIS — D225 Melanocytic nevi of trunk: Secondary | ICD-10-CM | POA: Diagnosis not present

## 2021-11-13 DIAGNOSIS — L814 Other melanin hyperpigmentation: Secondary | ICD-10-CM | POA: Diagnosis not present

## 2021-11-13 DIAGNOSIS — L570 Actinic keratosis: Secondary | ICD-10-CM | POA: Diagnosis not present

## 2021-11-21 DIAGNOSIS — E7800 Pure hypercholesterolemia, unspecified: Secondary | ICD-10-CM | POA: Diagnosis not present

## 2021-11-21 DIAGNOSIS — Z1211 Encounter for screening for malignant neoplasm of colon: Secondary | ICD-10-CM | POA: Diagnosis not present

## 2021-11-21 DIAGNOSIS — E039 Hypothyroidism, unspecified: Secondary | ICD-10-CM | POA: Diagnosis not present

## 2021-11-21 DIAGNOSIS — Z0000 Encounter for general adult medical examination without abnormal findings: Secondary | ICD-10-CM | POA: Diagnosis not present

## 2021-11-21 DIAGNOSIS — M810 Age-related osteoporosis without current pathological fracture: Secondary | ICD-10-CM | POA: Diagnosis not present

## 2021-11-21 DIAGNOSIS — R350 Frequency of micturition: Secondary | ICD-10-CM | POA: Diagnosis not present

## 2022-02-20 DIAGNOSIS — Z1211 Encounter for screening for malignant neoplasm of colon: Secondary | ICD-10-CM | POA: Diagnosis not present

## 2022-06-12 DIAGNOSIS — L821 Other seborrheic keratosis: Secondary | ICD-10-CM | POA: Diagnosis not present

## 2022-06-12 DIAGNOSIS — D485 Neoplasm of uncertain behavior of skin: Secondary | ICD-10-CM | POA: Diagnosis not present

## 2022-07-02 DIAGNOSIS — H35373 Puckering of macula, bilateral: Secondary | ICD-10-CM | POA: Diagnosis not present

## 2022-11-13 DIAGNOSIS — Z85828 Personal history of other malignant neoplasm of skin: Secondary | ICD-10-CM | POA: Diagnosis not present

## 2022-11-13 DIAGNOSIS — L738 Other specified follicular disorders: Secondary | ICD-10-CM | POA: Diagnosis not present

## 2022-11-13 DIAGNOSIS — L299 Pruritus, unspecified: Secondary | ICD-10-CM | POA: Diagnosis not present

## 2022-11-13 DIAGNOSIS — Z808 Family history of malignant neoplasm of other organs or systems: Secondary | ICD-10-CM | POA: Diagnosis not present

## 2022-11-13 DIAGNOSIS — L853 Xerosis cutis: Secondary | ICD-10-CM | POA: Diagnosis not present

## 2022-11-13 DIAGNOSIS — Z08 Encounter for follow-up examination after completed treatment for malignant neoplasm: Secondary | ICD-10-CM | POA: Diagnosis not present

## 2022-11-13 DIAGNOSIS — L814 Other melanin hyperpigmentation: Secondary | ICD-10-CM | POA: Diagnosis not present

## 2022-11-13 DIAGNOSIS — L815 Leukoderma, not elsewhere classified: Secondary | ICD-10-CM | POA: Diagnosis not present

## 2022-11-13 DIAGNOSIS — D225 Melanocytic nevi of trunk: Secondary | ICD-10-CM | POA: Diagnosis not present

## 2022-11-13 DIAGNOSIS — L821 Other seborrheic keratosis: Secondary | ICD-10-CM | POA: Diagnosis not present

## 2022-11-13 DIAGNOSIS — L570 Actinic keratosis: Secondary | ICD-10-CM | POA: Diagnosis not present

## 2022-11-14 DIAGNOSIS — R69 Illness, unspecified: Secondary | ICD-10-CM | POA: Diagnosis not present

## 2022-11-21 DIAGNOSIS — Z0100 Encounter for examination of eyes and vision without abnormal findings: Secondary | ICD-10-CM | POA: Diagnosis not present

## 2022-12-06 DIAGNOSIS — E039 Hypothyroidism, unspecified: Secondary | ICD-10-CM | POA: Diagnosis not present

## 2022-12-06 DIAGNOSIS — Z9181 History of falling: Secondary | ICD-10-CM | POA: Diagnosis not present

## 2022-12-06 DIAGNOSIS — R350 Frequency of micturition: Secondary | ICD-10-CM | POA: Diagnosis not present

## 2022-12-06 DIAGNOSIS — E7800 Pure hypercholesterolemia, unspecified: Secondary | ICD-10-CM | POA: Diagnosis not present

## 2022-12-06 DIAGNOSIS — Z01419 Encounter for gynecological examination (general) (routine) without abnormal findings: Secondary | ICD-10-CM | POA: Diagnosis not present

## 2022-12-06 DIAGNOSIS — M810 Age-related osteoporosis without current pathological fracture: Secondary | ICD-10-CM | POA: Diagnosis not present

## 2022-12-06 DIAGNOSIS — Z0000 Encounter for general adult medical examination without abnormal findings: Secondary | ICD-10-CM | POA: Diagnosis not present

## 2023-01-01 DIAGNOSIS — R69 Illness, unspecified: Secondary | ICD-10-CM | POA: Diagnosis not present

## 2023-07-16 DIAGNOSIS — H43813 Vitreous degeneration, bilateral: Secondary | ICD-10-CM | POA: Diagnosis not present

## 2023-07-17 DIAGNOSIS — E7800 Pure hypercholesterolemia, unspecified: Secondary | ICD-10-CM | POA: Diagnosis not present

## 2023-08-14 DIAGNOSIS — L089 Local infection of the skin and subcutaneous tissue, unspecified: Secondary | ICD-10-CM | POA: Diagnosis not present

## 2023-10-11 DIAGNOSIS — T63461A Toxic effect of venom of wasps, accidental (unintentional), initial encounter: Secondary | ICD-10-CM | POA: Diagnosis not present

## 2023-12-23 DIAGNOSIS — Z1211 Encounter for screening for malignant neoplasm of colon: Secondary | ICD-10-CM | POA: Diagnosis not present

## 2023-12-23 DIAGNOSIS — Z0000 Encounter for general adult medical examination without abnormal findings: Secondary | ICD-10-CM | POA: Diagnosis not present

## 2023-12-23 DIAGNOSIS — E7800 Pure hypercholesterolemia, unspecified: Secondary | ICD-10-CM | POA: Diagnosis not present

## 2023-12-23 DIAGNOSIS — E039 Hypothyroidism, unspecified: Secondary | ICD-10-CM | POA: Diagnosis not present

## 2023-12-23 DIAGNOSIS — M810 Age-related osteoporosis without current pathological fracture: Secondary | ICD-10-CM | POA: Diagnosis not present

## 2023-12-23 DIAGNOSIS — N393 Stress incontinence (female) (male): Secondary | ICD-10-CM | POA: Diagnosis not present

## 2024-01-13 DIAGNOSIS — L814 Other melanin hyperpigmentation: Secondary | ICD-10-CM | POA: Diagnosis not present

## 2024-01-13 DIAGNOSIS — L2989 Other pruritus: Secondary | ICD-10-CM | POA: Diagnosis not present

## 2024-01-13 DIAGNOSIS — Z872 Personal history of diseases of the skin and subcutaneous tissue: Secondary | ICD-10-CM | POA: Diagnosis not present

## 2024-01-13 DIAGNOSIS — L821 Other seborrheic keratosis: Secondary | ICD-10-CM | POA: Diagnosis not present

## 2024-01-13 DIAGNOSIS — L820 Inflamed seborrheic keratosis: Secondary | ICD-10-CM | POA: Diagnosis not present

## 2024-01-13 DIAGNOSIS — Z09 Encounter for follow-up examination after completed treatment for conditions other than malignant neoplasm: Secondary | ICD-10-CM | POA: Diagnosis not present

## 2024-01-13 DIAGNOSIS — D1801 Hemangioma of skin and subcutaneous tissue: Secondary | ICD-10-CM | POA: Diagnosis not present

## 2024-01-13 DIAGNOSIS — Z789 Other specified health status: Secondary | ICD-10-CM | POA: Diagnosis not present

## 2024-01-13 DIAGNOSIS — L538 Other specified erythematous conditions: Secondary | ICD-10-CM | POA: Diagnosis not present

## 2024-01-15 DIAGNOSIS — Z1211 Encounter for screening for malignant neoplasm of colon: Secondary | ICD-10-CM | POA: Diagnosis not present
